# Patient Record
Sex: Female | Born: 1975 | Race: Black or African American | Hispanic: No | Marital: Married | Smoking: Current every day smoker
Health system: Southern US, Community
[De-identification: ages and names within clinical notes are randomized; demographics above are authoritative.]

## PROBLEM LIST (undated history)

## (undated) DIAGNOSIS — G4734 Idiopathic sleep related nonobstructive alveolar hypoventilation: Secondary | ICD-10-CM

## (undated) DIAGNOSIS — R079 Chest pain, unspecified: Secondary | ICD-10-CM

## (undated) DIAGNOSIS — M1990 Unspecified osteoarthritis, unspecified site: Secondary | ICD-10-CM

## (undated) DIAGNOSIS — G43909 Migraine, unspecified, not intractable, without status migrainosus: Secondary | ICD-10-CM

## (undated) DIAGNOSIS — I10 Essential (primary) hypertension: Secondary | ICD-10-CM

## (undated) DIAGNOSIS — Z720 Tobacco use: Secondary | ICD-10-CM

## (undated) DIAGNOSIS — G5600 Carpal tunnel syndrome, unspecified upper limb: Secondary | ICD-10-CM

## (undated) DIAGNOSIS — I5032 Chronic diastolic (congestive) heart failure: Secondary | ICD-10-CM

## (undated) DIAGNOSIS — E662 Morbid (severe) obesity with alveolar hypoventilation: Secondary | ICD-10-CM

## (undated) HISTORY — PX: WRIST SURGERY: SHX841

## (undated) HISTORY — DX: Tobacco use: Z72.0

## (undated) HISTORY — PX: TONSILLECTOMY: SUR1361

## (undated) HISTORY — PX: TUBAL LIGATION: SHX77

## (undated) HISTORY — DX: Idiopathic sleep related nonobstructive alveolar hypoventilation: G47.34

## (undated) HISTORY — PX: CHOLECYSTECTOMY: SHX55

## (undated) HISTORY — DX: Chest pain, unspecified: R07.9

## (undated) HISTORY — DX: Morbid (severe) obesity due to excess calories: E66.01

## (undated) HISTORY — DX: Chronic diastolic (congestive) heart failure: I50.32

## (undated) HISTORY — DX: Morbid (severe) obesity with alveolar hypoventilation: E66.2

---

## 2009-03-07 ENCOUNTER — Emergency Department (HOSPITAL_COMMUNITY): Admission: EM | Admit: 2009-03-07 | Discharge: 2009-03-07 | Payer: Self-pay | Admitting: Emergency Medicine

## 2011-03-27 ENCOUNTER — Emergency Department (INDEPENDENT_AMBULATORY_CARE_PROVIDER_SITE_OTHER)
Admission: EM | Admit: 2011-03-27 | Discharge: 2011-03-27 | Disposition: A | Discharge status: Home / Self Care | Payer: Self-pay | Source: Home / Self Care | Attending: Family Medicine | Admitting: Family Medicine

## 2011-03-27 ENCOUNTER — Encounter (HOSPITAL_COMMUNITY): Payer: Self-pay | Admitting: *Deleted

## 2011-03-27 DIAGNOSIS — J069 Acute upper respiratory infection, unspecified: Secondary | ICD-10-CM

## 2011-03-27 MED ORDER — ACETAMINOPHEN-CODEINE 120-12 MG/5ML PO SUSP: 5.0000 mL | Freq: Four times a day (QID) | ORAL | Status: AC | PRN

## 2011-03-27 MED ORDER — IBUPROFEN 800 MG PO TABS: 800.0000 mg | ORAL_TABLET | Freq: Once | ORAL | Status: DC

## 2011-03-27 MED ORDER — ALBUTEROL SULFATE HFA 108 (90 BASE) MCG/ACT IN AERS: 1.0000 | INHALATION_SPRAY | Freq: Four times a day (QID) | RESPIRATORY_TRACT | Status: DC | PRN

## 2011-03-27 MED ORDER — IBUPROFEN 600 MG PO TABS: 600.0000 mg | ORAL_TABLET | Freq: Three times a day (TID) | ORAL | Status: AC | PRN

## 2011-03-27 MED ORDER — FEXOFENADINE-PSEUDOEPHED ER 60-120 MG PO TB12: 1.0000 | ORAL_TABLET | Freq: Two times a day (BID) | ORAL | Status: AC

## 2011-03-27 MED ORDER — IBUPROFEN 600 MG PO TABS
600.0000 mg | ORAL_TABLET | Freq: Once | ORAL | Status: DC
  Administered 2011-03-27: 800 mg via ORAL

## 2011-03-27 MED ORDER — AZITHROMYCIN 250 MG PO TABS: 250.0000 mg | ORAL_TABLET | Freq: Every day | ORAL | Status: AC

## 2011-03-27 MED ORDER — IBUPROFEN 800 MG PO TABS
ORAL_TABLET | ORAL | Status: AC
  Filled 2011-03-27: qty 1

## 2011-03-27 NOTE — ED Notes (Signed)
Onset 2-3 days ago with cough and nasal congestion.  Today c/o productive cough, weakness and sore throat.  Denies fever, but feels cold a lot

## 2011-03-27 NOTE — ED Provider Notes (Signed)
History     CSN: [id number]  Arrival date & time 03/27/11  1448   First MD Initiated Contact with Patient 03/27/11 1613      Chief Complaint  Patient presents with  . Cough    (Consider location/radiation/quality/duration/timing/severity/associated sxs/prior treatment) HPI Comments: 36 y/o obese, smoker female here c/o cough and congestion for 3 days.y cough productive with white phlegm, cough worse at night. No PND. No chest pain but upper abdominal pain bilateral with coughing. Symptomas associated with sore throat, headache and bilateral ear pressure. Feels tired. No fever but feels cold. No shortness of breath. Swallowing solids and fluids well. No history of diabetes.   Past Medical History  Diagnosis Date  . Obesity     Past Surgical History  Procedure Date  . Tonsillectomy   . Tonsillectomy     Family History  Problem Relation Age of Onset  . Hypertension Mother   . Cancer Mother   . Coronary artery disease Mother   . Osteoarthritis Mother   . Diabetes Father   . Hypertension Father   . Asthma Father   . Asthma Sister   . Diabetes Sister     History  Substance Use Topics  . Smoking status: Current Everyday Smoker -- 0.5 packs/day for 10 years    Types: Cigarettes  . Smokeless tobacco: Not on file  . Alcohol Use: No    OB History    Grav Para Term Preterm Abortions TAB SAB Ect Mult Living                  Review of Systems  Constitutional: Negative for fever and appetite change.  HENT: Positive for ear pain, congestion, sore throat and rhinorrhea. Negative for trouble swallowing, neck pain, voice change and sinus pressure.   Respiratory: Positive for cough. Negative for shortness of breath.   Cardiovascular: Negative for chest pain and leg swelling.  Gastrointestinal: Negative for nausea, vomiting and diarrhea.  Skin: Negative for rash.  Neurological: Positive for headaches.    Allergies  Review of patient's allergies indicates no known  allergies.  Home Medications   Current Outpatient Rx  Name Route Sig Dispense Refill  . ACETAMINOPHEN-CODEINE 120-12 MG/5ML PO SUSP Oral Take 5 mLs by mouth every 6 (six) hours as needed for pain. 60 mL 0  . ALBUTEROL SULFATE HFA 108 (90 BASE) MCG/ACT IN AERS Inhalation Inhale 1-2 puffs into the lungs every 6 (six) hours as needed for wheezing. 1 Inhaler 0  . AZITHROMYCIN 250 MG PO TABS Oral Take 1 tablet (250 mg total) by mouth daily. Take first 2 tablets together, then 1 every day until finished. 6 tablet 0  . FEXOFENADINE-PSEUDOEPHED ER 60-120 MG PO TB12 Oral Take 1 tablet by mouth every 12 (twelve) hours. 30 tablet 0  . IBUPROFEN 600 MG PO TABS Oral Take 1 tablet (600 mg total) by mouth every 8 (eight) hours as needed for pain or fever. 20 tablet 0    BP 132/87  Pulse 112  Temp(Src) 97.9 F (36.6 C) (Oral)  Resp 26  SpO2 99%  LMP 03/17/2011  Physical Exam  Nursing note and vitals reviewed. Constitutional: She is oriented to person, place, and time. No distress.       morbidly obese  HENT:       Nasal Congestion with erythema and swelling of nasal turbinates, clear rhinorrhea. Pharyngeal erythema no exudates. No uvula deviation. No trismus. TM's with increased vascular markings clear fluid behind TM's. No swelling.   Eyes:  Conjunctivae and EOM are normal. Pupils are equal, round, and reactive to light. Right eye exhibits no discharge. Left eye exhibits no discharge.  Neck: Neck supple. No JVD present. No thyromegaly present.  Cardiovascular: Regular rhythm and normal heart sounds.   Pulmonary/Chest: Effort normal and breath sounds normal. No respiratory distress. She has no wheezes. She has no rales. She exhibits no tenderness.  Abdominal: Soft. There is no tenderness.  Lymphadenopathy:    She has no cervical adenopathy.  Neurological: She is alert and oriented to person, place, and time.  Skin: No rash noted.    ED Course  Procedures (including critical care time)  Labs  Reviewed - No data to display No results found.   1. URI (upper respiratory infection)       MDM  Smoker with worsening cough and upper respiratory symptoms for 3 days. Encouraged to quit smoking. Decided to treat with albuterol, azithromycin, decongestant/antihistamin, ibuprofen. Red flags for respiratory distress that require return to emergency services discussed.        Sharin Grave, MD 03/28/11 1016

## 2011-03-30 ENCOUNTER — Telehealth (HOSPITAL_COMMUNITY): Payer: Self-pay | Admitting: *Deleted

## 2012-04-18 ENCOUNTER — Emergency Department (INDEPENDENT_AMBULATORY_CARE_PROVIDER_SITE_OTHER)
Admission: EM | Admit: 2012-04-18 | Discharge: 2012-04-18 | Disposition: A | Discharge status: Home / Self Care | Payer: Self-pay | Source: Home / Self Care | Attending: Emergency Medicine | Admitting: Emergency Medicine

## 2012-04-18 ENCOUNTER — Encounter (HOSPITAL_COMMUNITY): Payer: Self-pay | Admitting: Emergency Medicine

## 2012-04-18 ENCOUNTER — Emergency Department (INDEPENDENT_AMBULATORY_CARE_PROVIDER_SITE_OTHER): Payer: Self-pay

## 2012-04-18 DIAGNOSIS — J40 Bronchitis, not specified as acute or chronic: Secondary | ICD-10-CM

## 2012-04-18 DIAGNOSIS — J45909 Unspecified asthma, uncomplicated: Secondary | ICD-10-CM

## 2012-04-18 HISTORY — DX: Essential (primary) hypertension: I10

## 2012-04-18 HISTORY — DX: Unspecified osteoarthritis, unspecified site: M19.90

## 2012-04-18 HISTORY — DX: Carpal tunnel syndrome, unspecified upper limb: G56.00

## 2012-04-18 MED ORDER — ALBUTEROL SULFATE (5 MG/ML) 0.5% IN NEBU
INHALATION_SOLUTION | RESPIRATORY_TRACT | Status: AC
  Filled 2012-04-18: qty 1

## 2012-04-18 MED ORDER — ALBUTEROL SULFATE (5 MG/ML) 0.5% IN NEBU
5.0000 mg | INHALATION_SOLUTION | Freq: Once | RESPIRATORY_TRACT | Status: AC
  Administered 2012-04-18: 5 mg via RESPIRATORY_TRACT

## 2012-04-18 MED ORDER — ALBUTEROL SULFATE HFA 108 (90 BASE) MCG/ACT IN AERS: 1.0000 | INHALATION_SPRAY | Freq: Four times a day (QID) | RESPIRATORY_TRACT | Status: DC | PRN

## 2012-04-18 MED ORDER — PREDNISONE 20 MG PO TABS: 40.0000 mg | ORAL_TABLET | Freq: Every day | ORAL | Status: DC

## 2012-04-18 MED ORDER — AZITHROMYCIN 250 MG PO TABS: 250.0000 mg | ORAL_TABLET | Freq: Every day | ORAL | Status: DC

## 2012-04-18 NOTE — ED Notes (Signed)
Holding treatment until post xray

## 2012-04-18 NOTE — ED Notes (Signed)
Patient placed in gown for radiology procedure.

## 2012-04-18 NOTE — ED Notes (Signed)
Patient's treatment in progress, not able to be discharged presently

## 2012-04-18 NOTE — ED Notes (Signed)
Cough for a week, sob for 3 days.  Reports runny nose, slight sore throat, soreness around rib cage.

## 2012-04-18 NOTE — ED Provider Notes (Addendum)
History     CSN: [id number]  Arrival date & time 04/18/12  1243   First MD Initiated Contact with Patient 04/18/12 1338      Chief Complaint  Patient presents with  . Cough    (Consider location/radiation/quality/duration/timing/severity/associated sxs/prior treatment) HPI Comments: Patient process urgent care describing this she's been coughing and having a cold for about a week and a half, and for the last 3 days her symptoms had worsened she is right now that her cough is productive of phlegm sometimes looks yellowish in character. Described as having trouble breathing she gets short of breath has also some ongoing upper congestion  Such as a runny and congested nose and a slight sore throat." Been coughing so much that now my ribs hurt when I cough". Patient denies any abdominal pain nausea vomiting headaches or chest pain. Patient is a current smoker  Patient is a 37 y.o. female presenting with cough. The history is provided by the patient.  Cough Cough characteristics:  Productive and hoarse Sputum characteristics:  Yellow Severity:  Moderate Onset quality:  Sudden Duration:  1 week Timing:  Constant Progression:  Worsening Chronicity:  New Smoker: no   Context: not occupational exposure   Relieved by:  Nothing Worsened by:  Nothing tried Associated symptoms: chills, fever, rhinorrhea, sinus congestion, sore throat and wheezing   Associated symptoms: no diaphoresis, no ear pain and no headaches     Past Medical History  Diagnosis Date  . Obesity   . Bronchitis   . Hypertension   . Arthritis   . Carpal tunnel syndrome     Past Surgical History  Procedure Laterality Date  . Tonsillectomy    . Tonsillectomy    . Tubal ligation    . Wrist surgery      bilateral, carpal tunnel    Family History  Problem Relation Age of Onset  . Hypertension Mother   . Cancer Mother   . Coronary artery disease Mother   . Osteoarthritis Mother   . Diabetes Father   .  Hypertension Father   . Asthma Father   . Asthma Sister   . Diabetes Sister     History  Substance Use Topics  . Smoking status: Current Every Day Smoker -- 0.50 packs/day for 10 years    Types: Cigarettes  . Smokeless tobacco: Not on file  . Alcohol Use: No    OB History   Grav Para Term Preterm Abortions TAB SAB Ect Mult Living                  Review of Systems  Constitutional: Positive for fever, chills and activity change. Negative for diaphoresis.  HENT: Positive for sore throat and rhinorrhea. Negative for ear pain.   Respiratory: Positive for cough and wheezing. Negative for choking and stridor.   Gastrointestinal: Negative for nausea, vomiting and abdominal pain.  Musculoskeletal: Negative for back pain.  Skin: Negative for color change.  Neurological: Negative for weakness and headaches.    Allergies  Review of patient's allergies indicates no known allergies.  Home Medications   Current Outpatient Rx  Name  Route  Sig  Dispense  Refill  . OVER THE COUNTER MEDICATION      otc cough syrup and pain medicine         . EXPIRED: albuterol (PROVENTIL HFA;VENTOLIN HFA) 108 (90 BASE) MCG/ACT inhaler   Inhalation   Inhale 1-2 puffs into the lungs every 6 (six) hours as needed for wheezing.  1 Inhaler   0   . albuterol (PROVENTIL HFA;VENTOLIN HFA) 108 (90 BASE) MCG/ACT inhaler   Inhalation   Inhale 1-2 puffs into the lungs every 6 (six) hours as needed for wheezing.   1 Inhaler   0   . azithromycin (ZITHROMAX) 250 MG tablet   Oral   Take 1 tablet (250 mg total) by mouth daily. Take first 2 tablets together, then 1 every day until finished.   6 tablet   0   . predniSONE (DELTASONE) 20 MG tablet   Oral   Take 2 tablets (40 mg total) by mouth daily. 2 tablets daily for 5 days   10 tablet   0     BP 123/78  Pulse 90  Temp(Src) 98.7 F (37.1 C) (Oral)  Resp 30  SpO2 96%  LMP 04/18/2012  Physical Exam  Constitutional: She is oriented to person,  place, and time.  Morbid obesity  HENT:  Head: Normocephalic.  Right Ear: Tympanic membrane normal.  Left Ear: Tympanic membrane normal.  Mouth/Throat: Uvula is midline. Posterior oropharyngeal erythema present.  Eyes: Conjunctivae are normal. Pupils are equal, round, and reactive to light.  Neck: Neck supple. No JVD present.  Cardiovascular: Normal rate.  Exam reveals no gallop and no friction rub.   No murmur heard. Pulmonary/Chest: Effort normal. No accessory muscle usage. Not bradypneic. No respiratory distress. She has no decreased breath sounds. She has wheezes. She has no rales.   She exhibits no tenderness.  Neurological: She is alert and oriented to person, place, and time.  Skin: Skin is warm. No rash noted. No erythema.    ED Course  Procedures (including critical care time)  Labs Reviewed - No data to display Dg Chest 2 View  04/18/2012  *RADIOLOGY REPORT*  Clinical Data: Cough  CHEST - 2 VIEW  Comparison: 03/07/2009  Findings: Low volumes.  Bibasilar atelectasis.  Normal heart size. No consolidation or mass.  No pleural effusion.  No pneumothorax.  IMPRESSION: Bibasilar atelectasis.   Original Report Authenticated By: Jolaine Click, M.D.      1. Bronchitis   2. Reactive airway disease with wheezing       MDM  Problem #1 respiratory symptoms for more than a week with expiratory wheezing. X-rays revealed no pneumonic process. Bibasilar atelectasis is seen most likely secondary to body habitus. Patient responded well to a nebulizer treatment at urgent care. Have prescribed patient a course of 5 days of prednisone along with albuterol usage instructed the every 4-6 hours and a macrolide antibiotic course for 5 days. Patient has been instructed about what symptoms should warrant further evaluation in the emergency department if worsening symptoms or her return for recheck. Patient agrees with both treatment plan and followup care he is also requesting a doctor's note for  work. Which has been provided for the next 48 hours        Jimmie Molly, MD 04/18/12 1453  Jimmie Molly, MD 04/18/12 [phone]

## 2012-04-28 ENCOUNTER — Emergency Department (INDEPENDENT_AMBULATORY_CARE_PROVIDER_SITE_OTHER)
Admission: EM | Admit: 2012-04-28 | Discharge: 2012-04-28 | Disposition: A | Discharge status: Home / Self Care | Payer: Self-pay | Source: Home / Self Care | Attending: Emergency Medicine | Admitting: Emergency Medicine

## 2012-04-28 ENCOUNTER — Emergency Department (INDEPENDENT_AMBULATORY_CARE_PROVIDER_SITE_OTHER): Payer: Self-pay

## 2012-04-28 ENCOUNTER — Encounter (HOSPITAL_COMMUNITY): Payer: Self-pay

## 2012-04-28 DIAGNOSIS — R05 Cough: Secondary | ICD-10-CM

## 2012-04-28 DIAGNOSIS — J069 Acute upper respiratory infection, unspecified: Secondary | ICD-10-CM

## 2012-04-28 DIAGNOSIS — J45909 Unspecified asthma, uncomplicated: Secondary | ICD-10-CM

## 2012-04-28 MED ORDER — ALBUTEROL SULFATE (5 MG/ML) 0.5% IN NEBU
INHALATION_SOLUTION | RESPIRATORY_TRACT | Status: AC
  Filled 2012-04-28: qty 1

## 2012-04-28 MED ORDER — IPRATROPIUM BROMIDE 0.02 % IN SOLN
0.5000 mg | Freq: Once | RESPIRATORY_TRACT | Status: AC
  Administered 2012-04-28: 0.5 mg via RESPIRATORY_TRACT

## 2012-04-28 MED ORDER — METHYLPREDNISOLONE 4 MG PO KIT: PACK | ORAL | Status: DC

## 2012-04-28 MED ORDER — ALBUTEROL SULFATE (5 MG/ML) 0.5% IN NEBU
5.0000 mg | INHALATION_SOLUTION | Freq: Once | RESPIRATORY_TRACT | Status: AC
  Administered 2012-04-28: 5 mg via RESPIRATORY_TRACT

## 2012-04-28 MED ORDER — BECLOMETHASONE DIPROPIONATE 80 MCG/ACT IN AERS: INHALATION_SPRAY | RESPIRATORY_TRACT | Status: DC

## 2012-04-28 MED ORDER — PHENYLEPHRINE-CHLORPHEN-DM 10-4-12.5 MG/5ML PO LIQD: 5.0000 mL | ORAL | Status: DC | PRN

## 2012-04-28 MED ORDER — TRIAMCINOLONE ACETONIDE 40 MG/ML IJ SUSP
60.0000 mg | Freq: Once | INTRAMUSCULAR | Status: AC
  Administered 2012-04-28: 60 mg via INTRAMUSCULAR

## 2012-04-28 NOTE — ED Notes (Signed)
Pt states she has never gotten completely well from her 2-21 visit, continues to be SOB, and now it hurts to cough

## 2012-04-28 NOTE — ED Provider Notes (Signed)
Medical screening examination/treatment/procedure(s) were performed by non-physician practitioner and as supervising physician I was immediately available for consultation/collaboration.  Leslee Home, M.D.  Reuben Likes, MD 04/28/12 [phone]

## 2012-04-28 NOTE — ED Provider Notes (Signed)
History     CSN: [id number]  Arrival date & time 04/28/12  1018   First MD Initiated Contact with Patient 04/28/12 1021      Chief Complaint  Patient presents with  . Shortness of Breath    (Consider location/radiation/quality/duration/timing/severity/associated sxs/prior treatment) HPI Comments: 37 year old morbidly and severe obese female is here mostly 8 days after she was seen in the urgent care for pulmonary complaints. She had been diagnosed with wheezing, cough cold and bronchitis. She was treated with nebs, prednisone and Z-Pak. Today she states she is not any better she still has a cough and she is hurting in her ribs which she coughs.   Past Medical History  Diagnosis Date  . Obesity   . Bronchitis   . Hypertension   . Arthritis   . Carpal tunnel syndrome     Past Surgical History  Procedure Laterality Date  . Tonsillectomy    . Tonsillectomy    . Tubal ligation    . Wrist surgery      bilateral, carpal tunnel    Family History  Problem Relation Age of Onset  . Hypertension Mother   . Cancer Mother   . Coronary artery disease Mother   . Osteoarthritis Mother   . Diabetes Father   . Hypertension Father   . Asthma Father   . Asthma Sister   . Diabetes Sister     History  Substance Use Topics  . Smoking status: Current Every Day Smoker -- 0.50 packs/day for 10 years    Types: Cigarettes  . Smokeless tobacco: Not on file  . Alcohol Use: No    OB History   Grav Para Term Preterm Abortions TAB SAB Ect Mult Living                  Review of Systems  Constitutional: Negative for fever, chills, activity change, appetite change and fatigue.  HENT: Positive for congestion, rhinorrhea and postnasal drip. Negative for sore throat, facial swelling, neck pain and neck stiffness.   Eyes: Negative.   Respiratory: Positive for cough, shortness of breath and wheezing.   Cardiovascular: Positive for chest pain.  Gastrointestinal: Negative.   Skin: Negative  for pallor and rash.  Neurological: Negative.     Allergies  Review of patient's allergies indicates no known allergies.  Home Medications   Current Outpatient Rx  Name  Route  Sig  Dispense  Refill  . azithromycin (ZITHROMAX) 250 MG tablet   Oral   Take 1 tablet (250 mg total) by mouth daily. Take first 2 tablets together, then 1 every day until finished.   6 tablet   0   . EXPIRED: albuterol (PROVENTIL HFA;VENTOLIN HFA) 108 (90 BASE) MCG/ACT inhaler   Inhalation   Inhale 1-2 puffs into the lungs every 6 (six) hours as needed for wheezing.   1 Inhaler   0   . albuterol (PROVENTIL HFA;VENTOLIN HFA) 108 (90 BASE) MCG/ACT inhaler   Inhalation   Inhale 1-2 puffs into the lungs every 6 (six) hours as needed for wheezing.   1 Inhaler   0   . beclomethasone (QVAR) 80 MCG/ACT inhaler      Inhale 1 puff daily   1 Inhaler   12   . methylPREDNISolone (MEDROL DOSEPAK) 4 MG tablet      follow package directions   21 tablet   0   . OVER THE COUNTER MEDICATION      otc cough syrup and pain medicine         .  Phenylephrine-Chlorphen-DM 11-30-10.5 MG/5ML LIQD   Oral   Take 5 mLs by mouth every 4 (four) hours as needed.   120 mL   0   . predniSONE (DELTASONE) 20 MG tablet   Oral   Take 2 tablets (40 mg total) by mouth daily. 2 tablets daily for 5 days   10 tablet   0     BP 105/53  Pulse 26  Temp(Src) 97.9 F (36.6 C) (Oral)  SpO2 99%  LMP 04/16/2012  Physical Exam  Constitutional: She is oriented to person, place, and time. She appears well-developed and well-nourished. No distress.  Severe morbid obesity  HENT:  Mouth/Throat: Oropharynx is clear and moist. No oropharyngeal exudate.  Eyes: Conjunctivae and EOM are normal.  Neck: Normal range of motion. Neck supple.  Cardiovascular: Normal rate, regular rhythm and normal heart sounds.   Pulmonary/Chest: Effort normal and breath sounds normal. No respiratory distress.  Rare, faint expiratory wheeze.   Musculoskeletal: Normal range of motion. She exhibits no edema.  Lymphadenopathy:    She has no cervical adenopathy.  Neurological: She is alert and oriented to person, place, and time.  Skin: Skin is warm and dry. No rash noted.  Psychiatric: She has a normal mood and affect.    ED Course  Procedures (including critical care time)  Labs Reviewed - No data to display Dg Chest 2 View  04/28/2012  *RADIOLOGY REPORT*  Clinical Data: Cough and short of breath  CHEST - 2 VIEW  Comparison: 04/18/2012  Findings: Suboptimal images due to large patient size.  Negative for heart failure.  Increased density in the posterior lung base on the lateral view is most likely overlying soft tissues.  No definite pneumonia or effusion is identified.  IMPRESSION: No acute abnormality.   Original Report Authenticated By: Janeece Riggers, M.D.      1. URI (upper respiratory infection)   2. RAD (reactive airway disease) with wheezing   3. Cough       MDM  The patient had a duo neb. Post neb there was moderate improvement. No wheezes auscultated. Chest x-ray is negative for acute pulmonary disease Norell CS 1 teaspoon every 4 hours when necessary drainage and cough. Qvar 80 mg one puff daily Medrol 1 dose pack Plenty of fluids Out of work today        Hayden Rasmussen, NP 04/28/12 1242

## 2012-10-23 ENCOUNTER — Encounter (HOSPITAL_COMMUNITY): Payer: Self-pay | Admitting: Emergency Medicine

## 2012-10-23 ENCOUNTER — Emergency Department (INDEPENDENT_AMBULATORY_CARE_PROVIDER_SITE_OTHER)
Admission: EM | Admit: 2012-10-23 | Discharge: 2012-10-23 | Disposition: A | Discharge status: Home / Self Care | Payer: Self-pay | Source: Home / Self Care

## 2012-10-23 DIAGNOSIS — R0982 Postnasal drip: Secondary | ICD-10-CM

## 2012-10-23 DIAGNOSIS — J309 Allergic rhinitis, unspecified: Secondary | ICD-10-CM

## 2012-10-23 DIAGNOSIS — J45909 Unspecified asthma, uncomplicated: Secondary | ICD-10-CM

## 2012-10-23 MED ORDER — PREDNISONE 20 MG PO TABS: ORAL_TABLET | ORAL | Status: DC

## 2012-10-23 MED ORDER — ALBUTEROL SULFATE (5 MG/ML) 0.5% IN NEBU
INHALATION_SOLUTION | RESPIRATORY_TRACT | Status: AC
  Filled 2012-10-23: qty 1

## 2012-10-23 MED ORDER — PHENYLEPHRINE-CHLORPHEN-DM 10-4-12.5 MG/5ML PO LIQD: 5.0000 mL | ORAL | Status: DC | PRN

## 2012-10-23 MED ORDER — ALBUTEROL SULFATE (5 MG/ML) 0.5% IN NEBU
5.0000 mg | INHALATION_SOLUTION | Freq: Once | RESPIRATORY_TRACT | Status: AC
  Administered 2012-10-23: 5 mg via RESPIRATORY_TRACT

## 2012-10-23 NOTE — ED Provider Notes (Signed)
CSN: [id number]     Arrival date & time 10/23/12  1100 History   First MD Initiated Contact with Patient 10/23/12 1125     Chief Complaint  Patient presents with  . Asthma   (Consider location/radiation/quality/duration/timing/severity/associated sxs/prior Treatment) HPI Comments: 37 year old severe and morbidly obese female complaining of 3 weeks of difficulty breathing with upper respiratory congestion and a feeling of congestion in the upper chest, sneezing and coughing. She gets hot easily and that causes her to have migraines. She initially stated she had a sore throat but was talking about a sore throat for 3 weeks ago. She has postnasal discharge. Smokes one half to one pack per day. Does not have a history of asthma.   Past Medical History  Diagnosis Date  . Obesity   . Bronchitis   . Hypertension   . Arthritis   . Carpal tunnel syndrome    Past Surgical History  Procedure Laterality Date  . Tonsillectomy    . Tonsillectomy    . Tubal ligation    . Wrist surgery      bilateral, carpal tunnel   Family History  Problem Relation Age of Onset  . Hypertension Mother   . Cancer Mother   . Coronary artery disease Mother   . Osteoarthritis Mother   . Diabetes Father   . Hypertension Father   . Asthma Father   . Asthma Sister   . Diabetes Sister    History  Substance Use Topics  . Smoking status: Current Every Day Smoker -- 0.50 packs/day for 10 years    Types: Cigarettes  . Smokeless tobacco: Not on file  . Alcohol Use: No   OB History   Grav Para Term Preterm Abortions TAB SAB Ect Mult Living                 Review of Systems  Constitutional: Positive for activity change. Negative for fever.  HENT: Positive for congestion, rhinorrhea, sneezing and postnasal drip. Negative for ear pain, sore throat and neck pain.   Respiratory: Positive for cough and shortness of breath.   Cardiovascular: Negative.   Gastrointestinal: Negative.   Skin: Negative.    Neurological: Negative.     Allergies  Review of patient's allergies indicates no known allergies.  Home Medications   Current Outpatient Rx  Name  Route  Sig  Dispense  Refill  . EXPIRED: albuterol (PROVENTIL HFA;VENTOLIN HFA) 108 (90 BASE) MCG/ACT inhaler   Inhalation   Inhale 1-2 puffs into the lungs every 6 (six) hours as needed for wheezing.   1 Inhaler   0   . albuterol (PROVENTIL HFA;VENTOLIN HFA) 108 (90 BASE) MCG/ACT inhaler   Inhalation   Inhale 1-2 puffs into the lungs every 6 (six) hours as needed for wheezing.   1 Inhaler   0   . azithromycin (ZITHROMAX) 250 MG tablet   Oral   Take 1 tablet (250 mg total) by mouth daily. Take first 2 tablets together, then 1 every day until finished.   6 tablet   0   . beclomethasone (QVAR) 80 MCG/ACT inhaler      Inhale 1 puff daily   1 Inhaler   12   . methylPREDNISolone (MEDROL DOSEPAK) 4 MG tablet      follow package directions   21 tablet   0   . OVER THE COUNTER MEDICATION      otc cough syrup and pain medicine         . Phenylephrine-Chlorphen-DM 11-30-10.5  MG/5ML LIQD   Oral   Take 5 mLs by mouth every 4 (four) hours as needed.   120 mL   0   . Phenylephrine-Chlorphen-DM 11-30-10.5 MG/5ML LIQD   Oral   Take 5 mLs by mouth every 4 (four) hours as needed.   120 mL   0   . predniSONE (DELTASONE) 20 MG tablet   Oral   Take 2 tablets (40 mg total) by mouth daily. 2 tablets daily for 5 days   10 tablet   0   . predniSONE (DELTASONE) 20 MG tablet      Take 3 tabs po on first day, 2 tabs second day, 2 tabs third day, 1 tab fourth day, 1 tab 5th day. Take with food.   9 tablet   0    BP 92/58  Pulse 80  Temp(Src) 98 F (36.7 C) (Oral)  Resp 22  SpO2 100%  LMP 10/08/2012 Physical Exam  Nursing note and vitals reviewed. Constitutional: She is oriented to person, place, and time. No distress.  Severe morbid obesity. The patient remains sitting in the chair in the exam room. She complete  sentences and does not appear to be any respiratory distress.  HENT:  Right Ear: External ear normal.  Left Ear: External ear normal.  Mouth/Throat: No oropharyngeal exudate.  OP with minor erythema and clear PND.  Eyes: Conjunctivae are normal.  Neck: Normal range of motion. Neck supple.  Cardiovascular: Normal rate, regular rhythm and normal heart sounds.   Pulmonary/Chest:  Body habitus prevents good assessment of the lungs with auscultation. Air movement is fair. I do not hear any wheezes. She does have a loose cough and PND.  Lymphadenopathy:    She has no cervical adenopathy.  Neurological: She is alert and oriented to person, place, and time.  Skin: Skin is warm and dry. No rash noted.  Psychiatric: She has a normal mood and affect.    ED Course  Procedures (including critical care time) Labs Review Labs Reviewed - No data to display Imaging Review No results found.  MDM   1. Allergic rhinitis   2. PND (post-nasal drip)   3. RAD (reactive airway disease) with wheezing    Post albuterol neb treatment patient states she is breathing better and coughing less. Lung sounds are clear and there is improvement in air movement. No wheezing or other adventitious sounds. Her cough is due in part to PND and occult bronchospasm. Norell CS 1 teaspoon every 4 hours when necessary cough Continue using your albuterol, Qvar and other medications. Prednisone as directed. Followup your PCP as needed. For worsening new symptoms problems may return    Hayden Rasmussen, NP 10/23/12 1301

## 2012-10-23 NOTE — ED Notes (Signed)
C/o congestion and coughing for three weeks.  OTC medication taken but no relief.

## 2012-10-25 NOTE — ED Provider Notes (Signed)
Medical screening examination/treatment/procedure(s) were performed by a resident physician or non-physician practitioner and as the supervising physician I was immediately available for consultation/collaboration.  Twisha Vanpelt, MD   Johannah Rozas S Eleri Ruben, MD 10/25/12 0755 

## 2013-02-28 ENCOUNTER — Emergency Department (HOSPITAL_COMMUNITY)
Admission: EM | Admit: 2013-02-28 | Discharge: 2013-02-28 | Disposition: A | Discharge status: Home / Self Care | Payer: No Typology Code available for payment source | Attending: Emergency Medicine | Admitting: Emergency Medicine

## 2013-02-28 ENCOUNTER — Emergency Department (HOSPITAL_COMMUNITY)
Admission: EM | Admit: 2013-02-28 | Discharge: 2013-02-28 | Disposition: A | Discharge status: Other Acute Inpatient Hospital | Payer: No Typology Code available for payment source | Source: Home / Self Care | Attending: Family Medicine | Admitting: Family Medicine

## 2013-02-28 ENCOUNTER — Emergency Department (HOSPITAL_COMMUNITY): Payer: No Typology Code available for payment source

## 2013-02-28 ENCOUNTER — Encounter (HOSPITAL_COMMUNITY): Payer: Self-pay | Admitting: Emergency Medicine

## 2013-02-28 DIAGNOSIS — I10 Essential (primary) hypertension: Secondary | ICD-10-CM | POA: Insufficient documentation

## 2013-02-28 DIAGNOSIS — Z79899 Other long term (current) drug therapy: Secondary | ICD-10-CM | POA: Insufficient documentation

## 2013-02-28 DIAGNOSIS — Z8669 Personal history of other diseases of the nervous system and sense organs: Secondary | ICD-10-CM | POA: Insufficient documentation

## 2013-02-28 DIAGNOSIS — F17200 Nicotine dependence, unspecified, uncomplicated: Secondary | ICD-10-CM | POA: Insufficient documentation

## 2013-02-28 DIAGNOSIS — J069 Acute upper respiratory infection, unspecified: Secondary | ICD-10-CM | POA: Insufficient documentation

## 2013-02-28 DIAGNOSIS — J4521 Mild intermittent asthma with (acute) exacerbation: Secondary | ICD-10-CM

## 2013-02-28 DIAGNOSIS — IMO0002 Reserved for concepts with insufficient information to code with codable children: Secondary | ICD-10-CM | POA: Insufficient documentation

## 2013-02-28 DIAGNOSIS — J45901 Unspecified asthma with (acute) exacerbation: Secondary | ICD-10-CM

## 2013-02-28 DIAGNOSIS — Z792 Long term (current) use of antibiotics: Secondary | ICD-10-CM | POA: Insufficient documentation

## 2013-02-28 DIAGNOSIS — M129 Arthropathy, unspecified: Secondary | ICD-10-CM | POA: Insufficient documentation

## 2013-02-28 MED ORDER — PREDNISONE 20 MG PO TABS
60.0000 mg | ORAL_TABLET | Freq: Once | ORAL | Status: AC
  Administered 2013-02-28: 60 mg via ORAL
  Filled 2013-02-28: qty 3

## 2013-02-28 MED ORDER — ALBUTEROL SULFATE HFA 108 (90 BASE) MCG/ACT IN AERS: 1.0000 | INHALATION_SPRAY | Freq: Four times a day (QID) | RESPIRATORY_TRACT | Status: DC | PRN

## 2013-02-28 MED ORDER — IPRATROPIUM BROMIDE 0.02 % IN SOLN
0.5000 mg | Freq: Once | RESPIRATORY_TRACT | Status: AC
  Administered 2013-02-28: 0.5 mg via RESPIRATORY_TRACT
  Filled 2013-02-28: qty 2.5

## 2013-02-28 MED ORDER — PREDNISONE 20 MG PO TABS: 60.0000 mg | ORAL_TABLET | Freq: Every day | ORAL | Status: DC

## 2013-02-28 MED ORDER — IPRATROPIUM BROMIDE 0.02 % IN SOLN
0.5000 mg | Freq: Once | RESPIRATORY_TRACT | Status: AC
  Administered 2013-02-28: 0.5 mg via RESPIRATORY_TRACT

## 2013-02-28 MED ORDER — ALBUTEROL SULFATE (2.5 MG/3ML) 0.083% IN NEBU
5.0000 mg | INHALATION_SOLUTION | Freq: Once | RESPIRATORY_TRACT | Status: AC
  Administered 2013-02-28: 5 mg via RESPIRATORY_TRACT

## 2013-02-28 MED ORDER — IPRATROPIUM BROMIDE 0.02 % IN SOLN
RESPIRATORY_TRACT | Status: AC
  Filled 2013-02-28: qty 2.5

## 2013-02-28 MED ORDER — HYDROCODONE-HOMATROPINE 5-1.5 MG/5ML PO SYRP: 5.0000 mL | ORAL_SOLUTION | Freq: Four times a day (QID) | ORAL | Status: DC | PRN

## 2013-02-28 MED ORDER — ALBUTEROL SULFATE (2.5 MG/3ML) 0.083% IN NEBU
5.0000 mg | INHALATION_SOLUTION | Freq: Once | RESPIRATORY_TRACT | Status: AC
  Administered 2013-02-28: 5 mg via RESPIRATORY_TRACT
  Filled 2013-02-28: qty 6

## 2013-02-28 MED ORDER — ALBUTEROL SULFATE (2.5 MG/3ML) 0.083% IN NEBU
INHALATION_SOLUTION | RESPIRATORY_TRACT | Status: AC
  Filled 2013-02-28: qty 6

## 2013-02-28 NOTE — ED Provider Notes (Signed)
Medical screening examination/treatment/procedure(s) were performed by non-physician practitioner and as supervising physician I was immediately available for consultation/collaboration.  EKG Interpretation   None         William Jaelah Hauth, MD 02/28/13 1553 

## 2013-02-28 NOTE — ED Provider Notes (Signed)
CSN: [id number]     Arrival date & time 02/28/13  1004 History   First MD Initiated Contact with Patient 02/28/13 1014     Chief Complaint  Patient presents with  . Cough   (Consider location/radiation/quality/duration/timing/severity/associated sxs/prior Treatment) Patient is a 38 y.o. female presenting with cough. The history is provided by the patient.  Cough Cough characteristics:  Non-productive, dry, hoarse and harsh Severity:  Moderate Onset quality:  Gradual Duration:  6 weeks Progression:  Worsening Chronicity:  Recurrent (sick since Thanksgiving ,off and on, relapsed sev days ago ,with cough and sob, with dry heaves and diarrhea since yest.) Smoker: yes   Context: upper respiratory infection   Associated symptoms: chest pain, shortness of breath and wheezing   Associated symptoms: no fever     Past Medical History  Diagnosis Date  . Obesity   . Bronchitis   . Hypertension   . Arthritis   . Carpal tunnel syndrome    Past Surgical History  Procedure Laterality Date  . Tonsillectomy    . Tonsillectomy    . Tubal ligation    . Wrist surgery      bilateral, carpal tunnel   Family History  Problem Relation Age of Onset  . Hypertension Mother   . Cancer Mother   . Coronary artery disease Mother   . Osteoarthritis Mother   . Diabetes Father   . Hypertension Father   . Asthma Father   . Asthma Sister   . Diabetes Sister    History  Substance Use Topics  . Smoking status: Current Every Day Smoker -- 0.50 packs/day for 10 years    Types: Cigarettes  . Smokeless tobacco: Not on file  . Alcohol Use: No   OB History   Grav Para Term Preterm Abortions TAB SAB Ect Mult Living                 Review of Systems  Constitutional: Negative.  Negative for fever.  Respiratory: Positive for cough, shortness of breath and wheezing.   Cardiovascular: Positive for chest pain.    Allergies  Review of patient's allergies indicates no known allergies.  Home  Medications   Current Outpatient Rx  Name  Route  Sig  Dispense  Refill  . EXPIRED: albuterol (PROVENTIL HFA;VENTOLIN HFA) 108 (90 BASE) MCG/ACT inhaler   Inhalation   Inhale 1-2 puffs into the lungs every 6 (six) hours as needed for wheezing.   1 Inhaler   0   . albuterol (PROVENTIL HFA;VENTOLIN HFA) 108 (90 BASE) MCG/ACT inhaler   Inhalation   Inhale 1-2 puffs into the lungs every 6 (six) hours as needed for wheezing.   1 Inhaler   0   . azithromycin (ZITHROMAX) 250 MG tablet   Oral   Take 1 tablet (250 mg total) by mouth daily. Take first 2 tablets together, then 1 every day until finished.   6 tablet   0   . beclomethasone (QVAR) 80 MCG/ACT inhaler      Inhale 1 puff daily   1 Inhaler   12   . methylPREDNISolone (MEDROL DOSEPAK) 4 MG tablet      follow package directions   21 tablet   0   . OVER THE COUNTER MEDICATION      otc cough syrup and pain medicine         . Phenylephrine-Chlorphen-DM 11-30-10.5 MG/5ML LIQD   Oral   Take 5 mLs by mouth every 4 (four) hours as needed.  120 mL   0   . Phenylephrine-Chlorphen-DM 11-30-10.5 MG/5ML LIQD   Oral   Take 5 mLs by mouth every 4 (four) hours as needed.   120 mL   0   . predniSONE (DELTASONE) 20 MG tablet   Oral   Take 2 tablets (40 mg total) by mouth daily. 2 tablets daily for 5 days   10 tablet   0   . predniSONE (DELTASONE) 20 MG tablet      Take 3 tabs po on first day, 2 tabs second day, 2 tabs third day, 1 tab fourth day, 1 tab 5th day. Take with food.   9 tablet   0    BP 150/82  Pulse 94  Temp(Src) 98.6 F (37 C) (Oral)  Resp 28  SpO2 96%  LMP 02/28/2013 Physical Exam  Nursing note and vitals reviewed. Constitutional: She is oriented to person, place, and time. She appears well-developed and well-nourished.  HENT:  Head: Normocephalic.  Mouth/Throat: Oropharynx is clear and moist.  Eyes: Conjunctivae are normal. Pupils are equal, round, and reactive to light.  Neck: Normal range of  motion. Neck supple.  Cardiovascular: Regular rhythm and normal heart sounds.   Pulmonary/Chest: She has decreased breath sounds. She has wheezes.   She exhibits tenderness.  Lymphadenopathy:    She has no cervical adenopathy.  Neurological: She is alert and oriented to person, place, and time.  Skin: Skin is warm and dry.    ED Course  Procedures (including critical care time) Labs Review Labs Reviewed - No data to display Imaging Review No results found.  EKG Interpretation    Date/Time:    Ventricular Rate:    PR Interval:    QRS Duration:   QT Interval:    QTC Calculation:   R Axis:     Text Interpretation:              MDM  Sent for x-ray and further pulmonary care.    Linna Hoff, MD 02/28/13 [phone]

## 2013-02-28 NOTE — ED Provider Notes (Signed)
CSN: [id number]     Arrival date & time 02/28/13  1207 History   First MD Initiated Contact with Patient 02/28/13 1332     Chief Complaint  Patient presents with  . Cough  . Asthma   (Consider location/radiation/quality/duration/timing/severity/associated sxs/prior Treatment) HPI Comments: Patient present with a chief complaint of cough and SOB.  She reports that her cough has been present for the past 6 weeks intermittently.  Cough reoccurred two days ago and has been gradually worsening since that time.  She reports that her cough is associated with some shortness of breath and wheezing.  [facility] to arrival in the ED and sent to the ED to obtain a chest xray.  She reports that she has never been formally diagnosed with Asthma, but has been on inhalers in the past.  She denies fever, chills, chest pain, nausea, vomiting, or lower extremity edema.  She reports that she has taken Robitussin for her symptoms without relief.  [facility], which she states helped with her SOB.  The history is provided by the patient.    Past Medical History  Diagnosis Date  . Obesity   . Bronchitis   . Hypertension   . Arthritis   . Carpal tunnel syndrome    Past Surgical History  Procedure Laterality Date  . Tonsillectomy    . Tonsillectomy    . Tubal ligation    . Wrist surgery      bilateral, carpal tunnel   Family History  Problem Relation Age of Onset  . Hypertension Mother   . Cancer Mother   . Coronary artery disease Mother   . Osteoarthritis Mother   . Diabetes Father   . Hypertension Father   . Asthma Father   . Asthma Sister   . Diabetes Sister    History  Substance Use Topics  . Smoking status: Current Every Day Smoker -- 0.50 packs/day for 10 years    Types: Cigarettes  . Smokeless tobacco: Not on file  . Alcohol Use: No   OB History   Grav Para Term Preterm Abortions TAB SAB Ect Mult Living                  Review of Systems  All other systems reviewed and are negative.    Allergies  Review of patient's allergies indicates no known allergies.  Home Medications   Current Outpatient Rx  Name  Route  Sig  Dispense  Refill  . EXPIRED: albuterol (PROVENTIL HFA;VENTOLIN HFA) 108 (90 BASE) MCG/ACT inhaler   Inhalation   Inhale 1-2 puffs into the lungs every 6 (six) hours as needed for wheezing.   1 Inhaler   0   . albuterol (PROVENTIL HFA;VENTOLIN HFA) 108 (90 BASE) MCG/ACT inhaler   Inhalation   Inhale 1-2 puffs into the lungs every 6 (six) hours as needed for wheezing.   1 Inhaler   0   . azithromycin (ZITHROMAX) 250 MG tablet   Oral   Take 1 tablet (250 mg total) by mouth daily. Take first 2 tablets together, then 1 every day until finished.   6 tablet   0   . beclomethasone (QVAR) 80 MCG/ACT inhaler      Inhale 1 puff daily   1 Inhaler   12   . methylPREDNISolone (MEDROL DOSEPAK) 4 MG tablet      follow package directions   21 tablet  0   . OVER THE COUNTER MEDICATION      otc cough syrup and pain medicine         . Phenylephrine-Chlorphen-DM 11-30-10.5 MG/5ML LIQD   Oral   Take 5 mLs by mouth every 4 (four) hours as needed.   120 mL   0   . Phenylephrine-Chlorphen-DM 11-30-10.5 MG/5ML LIQD   Oral   Take 5 mLs by mouth every 4 (four) hours as needed.   120 mL   0   . predniSONE (DELTASONE) 20 MG tablet   Oral   Take 2 tablets (40 mg total) by mouth daily. 2 tablets daily for 5 days   10 tablet   0   . predniSONE (DELTASONE) 20 MG tablet      Take 3 tabs po on first day, 2 tabs second day, 2 tabs third day, 1 tab fourth day, 1 tab 5th day. Take with food.   9 tablet   0    BP 141/82  Pulse 89  Temp(Src) 98.2 F (36.8 C) (Oral)  Resp 20  Wt 460 lb 9.6 oz (208.927 kg)  SpO2 99%  LMP 02/28/2013 Physical Exam  Nursing note and vitals reviewed. Constitutional: She appears well-developed and well-nourished.  Morbidly obese  HENT:   Head: Normocephalic and atraumatic.  Right Ear: Tympanic membrane and ear canal normal.  Left Ear: Tympanic membrane and ear canal normal.  Nose: Nose normal.  Mouth/Throat: Uvula is midline, oropharynx is clear and moist and mucous membranes are normal.  Neck: Normal range of motion. Neck supple.  Cardiovascular: Normal rate, regular rhythm and normal heart sounds.   Pulmonary/Chest: Effort normal and breath sounds normal. No respiratory distress. She has no wheezes. She has no rales.  Musculoskeletal: Normal range of motion.  Neurological: She is alert.  Skin: Skin is warm and dry.  Psychiatric: She has a normal mood and affect.    ED Course  Procedures (including critical care time) Labs Review Labs Reviewed - No data to display Imaging Review Dg Chest 2 View  02/28/2013   CLINICAL DATA:  Chest tightness.  EXAM: CHEST  2 VIEW  COMPARISON:  04/28/2012  FINDINGS: The heart size and mediastinal contours are within normal limits. Both lungs are clear. The visualized skeletal structures are unremarkable.  IMPRESSION: No active cardiopulmonary disease.   Electronically Signed   By: Richarda Overlie M.D.   On: 02/28/2013 14:39    EKG Interpretation   None      2:51 PM Reassessed patient.  She reports that her SOB has improved at this time.  Lungs CTAB. MDM  No diagnosis found. Patient presents today with a chief complaint of cough and SOB.  Pulse ox  99-100 on RA.  No signs of respiratory distress.  CXR negative.  Shortness of breath improved after getting breathing treatments.  Lungs CTAB.  Patient also given a five day course of Prednisone.  Feel that the the patient is stable for discharge.  Return precautions given.    Santiago Glad, PA-C 02/28/13 1545

## 2013-02-28 NOTE — ED Notes (Signed)
Pt  Reports  Symptoms  Of  Cough  Congestion        Tightness  In  Chest  As  Well  As  Ome  Difficulty  Breathing  For sev  Days   Pt reports  Symptoms  Not  releived  By otc  meds

## 2013-02-28 NOTE — ED Notes (Signed)
[facility] Having non productive cough x 6 weeks, hoarseness, sob. Pt received breathing tx at [facility]. Airway intact, no resp distress noted at triage. Pt is morbidly obese, sent here for xray.

## 2013-07-11 ENCOUNTER — Encounter (HOSPITAL_COMMUNITY): Payer: Self-pay | Admitting: Emergency Medicine

## 2013-07-11 ENCOUNTER — Emergency Department (HOSPITAL_COMMUNITY): Payer: No Typology Code available for payment source

## 2013-07-11 ENCOUNTER — Emergency Department (HOSPITAL_COMMUNITY)
Admission: EM | Admit: 2013-07-11 | Discharge: 2013-07-11 | Disposition: A | Discharge status: Home / Self Care | Payer: No Typology Code available for payment source | Attending: Emergency Medicine | Admitting: Emergency Medicine

## 2013-07-11 DIAGNOSIS — Z8739 Personal history of other diseases of the musculoskeletal system and connective tissue: Secondary | ICD-10-CM | POA: Insufficient documentation

## 2013-07-11 DIAGNOSIS — IMO0002 Reserved for concepts with insufficient information to code with codable children: Secondary | ICD-10-CM | POA: Insufficient documentation

## 2013-07-11 DIAGNOSIS — Z8669 Personal history of other diseases of the nervous system and sense organs: Secondary | ICD-10-CM | POA: Insufficient documentation

## 2013-07-11 DIAGNOSIS — J45901 Unspecified asthma with (acute) exacerbation: Secondary | ICD-10-CM | POA: Insufficient documentation

## 2013-07-11 DIAGNOSIS — Z79899 Other long term (current) drug therapy: Secondary | ICD-10-CM | POA: Insufficient documentation

## 2013-07-11 DIAGNOSIS — F17200 Nicotine dependence, unspecified, uncomplicated: Secondary | ICD-10-CM | POA: Insufficient documentation

## 2013-07-11 DIAGNOSIS — Z8709 Personal history of other diseases of the respiratory system: Secondary | ICD-10-CM | POA: Insufficient documentation

## 2013-07-11 DIAGNOSIS — I10 Essential (primary) hypertension: Secondary | ICD-10-CM | POA: Insufficient documentation

## 2013-07-11 DIAGNOSIS — J069 Acute upper respiratory infection, unspecified: Secondary | ICD-10-CM | POA: Insufficient documentation

## 2013-07-11 DIAGNOSIS — J45909 Unspecified asthma, uncomplicated: Secondary | ICD-10-CM

## 2013-07-11 LAB — BASIC METABOLIC PANEL
BUN: 5 mg/dL — AB (ref 6–23)
CHLORIDE: 102 meq/L (ref 96–112)
CO2: 21 mEq/L (ref 19–32)
Calcium: 9 mg/dL (ref 8.4–10.5)
Creatinine, Ser: 0.49 mg/dL — ABNORMAL LOW (ref 0.50–1.10)
GFR calc non Af Amer: >90 mL/min (ref >90)
Glucose, Bld: 178 mg/dL — ABNORMAL HIGH (ref 70–99)
POTASSIUM: 4.3 meq/L (ref 3.7–5.3)
Sodium: 138 mEq/L (ref 137–147)

## 2013-07-11 LAB — CBC
HEMATOCRIT: 42.2 % (ref 36.0–46.0)
HEMOGLOBIN: 13.9 g/dL (ref 12.0–15.0)
MCH: 33.4 pg (ref 26.0–34.0)
MCHC: 32.9 g/dL (ref 30.0–36.0)
MCV: 101.4 fL — ABNORMAL HIGH (ref 78.0–100.0)
Platelets: 347 10*3/uL (ref 150–400)
RBC: 4.16 MIL/uL (ref 3.87–5.11)
RDW: 13.8 % (ref 11.5–15.5)
WBC: 6.3 10*3/uL (ref 4.0–10.5)

## 2013-07-11 LAB — I-STAT TROPONIN, ED: Troponin i, poc: 0 ng/mL (ref 0.00–0.08)

## 2013-07-11 LAB — PRO B NATRIURETIC PEPTIDE: PRO B NATRI PEPTIDE: 14.6 pg/mL (ref 0–125)

## 2013-07-11 MED ORDER — IPRATROPIUM-ALBUTEROL 0.5-2.5 (3) MG/3ML IN SOLN
3.0000 mL | Freq: Once | RESPIRATORY_TRACT | Status: AC
  Administered 2013-07-11: 3 mL via RESPIRATORY_TRACT
  Filled 2013-07-11: qty 3

## 2013-07-11 MED ORDER — ALBUTEROL SULFATE HFA 108 (90 BASE) MCG/ACT IN AERS: 1.0000 | INHALATION_SPRAY | RESPIRATORY_TRACT | Status: DC | PRN

## 2013-07-11 MED ORDER — AZITHROMYCIN 250 MG PO TABS: ORAL_TABLET | ORAL | Status: DC

## 2013-07-11 MED ORDER — PREDNISONE 20 MG PO TABS
60.0000 mg | ORAL_TABLET | Freq: Once | ORAL | Status: AC
  Administered 2013-07-11: 60 mg via ORAL
  Filled 2013-07-11: qty 3

## 2013-07-11 MED ORDER — PREDNISONE 20 MG PO TABS: 40.0000 mg | ORAL_TABLET | Freq: Every day | ORAL | Status: DC

## 2013-07-11 MED ORDER — FLUTICASONE PROPIONATE 50 MCG/ACT NA SUSP: 2.0000 | Freq: Every day | NASAL | Status: DC

## 2013-07-11 NOTE — Discharge Instructions (Signed)
Use nasal saline (you can try [employer] Simply Saline) at least 4 times a day, use saline 5-10 minutes before using the fluticasone (flonase)  Do not use Afrin (Oxymetazoline)  Rest, wash hands frequently  and drink plenty of water.  Do not hesitate to return to the Emergency Department for any new, worsening or concerning symptoms.   If you do not have a primary care doctor you can establish one at the   [facility]: [address] [place] [address] [phone]  After you establish care. Let them know you were seen in the emergency room. They must obtain records for further management.     Bronchitis Bronchitis is inflammation of the airways that extend from the windpipe into the lungs (bronchi). The inflammation often causes mucus to develop, which leads to a cough. If the inflammation becomes severe, it may cause shortness of breath. CAUSES  Bronchitis may be caused by:   Viral infections.   Bacteria.   Cigarette smoke.   Allergens, pollutants, and other irritants.  SIGNS AND SYMPTOMS  The most common symptom of bronchitis is a frequent cough that produces mucus. Other symptoms include:  Fever.   Body aches.   Chest congestion.   Chills.   Shortness of breath.   Sore throat.  DIAGNOSIS  Bronchitis is usually diagnosed through a medical history and physical exam. Tests, such as chest X-rays, are sometimes done to rule out other conditions.  TREATMENT  You may need to avoid contact with whatever caused the problem (smoking, for example). Medicines are sometimes needed. These may include:  Antibiotics. These may be prescribed if the condition is caused by bacteria.  Cough suppressants. These may be prescribed for relief of cough symptoms.   Inhaled medicines. These may be prescribed to help open your airways and make it easier for you to breathe.   Steroid medicines. These may be prescribed for those with recurrent  (chronic) bronchitis. HOME CARE INSTRUCTIONS  Get plenty of rest.   Drink enough fluids to keep your urine clear or pale yellow (unless you have a medical condition that requires fluid restriction). Increasing fluids may help thin your secretions and will prevent dehydration.   Only take over-the-counter or prescription medicines as directed by your health care provider.  Only take antibiotics as directed. Make sure you finish them even if you start to feel better.  Avoid secondhand smoke, irritating chemicals, and strong fumes. These will make bronchitis worse. If you are a smoker, quit smoking. Consider using nicotine gum or skin patches to help control withdrawal symptoms. Quitting smoking will help your lungs heal faster.   Put a cool-mist humidifier in your bedroom at night to moisten the air. This may help loosen mucus. Change the water in the humidifier daily. You can also run the hot water in your shower and sit in the bathroom with the door closed for 5 10 minutes.   Follow up with your health care provider as directed.   Wash your hands frequently to avoid catching bronchitis again or spreading an infection to others.  SEEK MEDICAL CARE IF: Your symptoms do not improve after 1 week of treatment.  SEEK IMMEDIATE MEDICAL CARE IF:  Your fever increases.  You have chills.   You have chest pain.   You have worsening shortness of breath.   You have bloody sputum.  You faint.  You have lightheadedness.  You have a severe headache.   You vomit repeatedly. MAKE SURE YOU:  Understand these instructions.  Will watch your condition.  Will get help right away if you are not doing well or get worse. Document Released: 02/12/2005 Document Revised: 12/03/2012 Document Reviewed: 10/07/2012 [facility] Patient Information 2014 [place].

## 2013-07-11 NOTE — ED Provider Notes (Signed)
CSN: [id number]     Arrival date & time 07/11/13  1206 History   First MD Initiated Contact with Patient 07/11/13 1301     Chief Complaint  Patient presents with  . Shortness of Breath     (Consider location/radiation/quality/duration/timing/severity/associated sxs/prior Treatment) HPI  Jillian Weber is a 38 y.o. female with past medical history of obesity, bronchitis complaining of runny nose, sinus congestion, subjective fever and shortness of breath. Symptoms have been worsening since Tuesday, preceded by sinus pressure. No medication prior to arrival. Patient denies increasing peripheral edema, chest pain, abdominal pain, nausea, vomiting, cough.    Past Medical History  Diagnosis Date  . Obesity   . Bronchitis   . Hypertension   . Arthritis   . Carpal tunnel syndrome    Past Surgical History  Procedure Laterality Date  . Tonsillectomy    . Tonsillectomy    . Tubal ligation    . Wrist surgery      bilateral, carpal tunnel   Family History  Problem Relation Age of Onset  . Hypertension Mother   . Cancer Mother   . Coronary artery disease Mother   . Osteoarthritis Mother   . Diabetes Father   . Hypertension Father   . Asthma Father   . Asthma Sister   . Diabetes Sister    History  Substance Use Topics  . Smoking status: Current Every Day Smoker -- 0.50 packs/day for 10 years    Types: Cigarettes  . Smokeless tobacco: Not on file  . Alcohol Use: No   OB History   Grav Para Term Preterm Abortions TAB SAB Ect Mult Living                 Review of Systems  10 systems reviewed and found to be negative, except as noted in the HPI.   Allergies  Review of patient's allergies indicates no known allergies.  Home Medications   Prior to Admission medications   Medication Sig Start Date End Date Taking? Authorizing Provider  albuterol (PROVENTIL HFA;VENTOLIN HFA) 108 (90 BASE) MCG/ACT inhaler Inhale 1-2 puffs into the lungs every 6 (six) hours as needed  for wheezing or shortness of breath. 02/28/13   Heather Laisure, PA-C  guaiFENesin (ROBITUSSIN) 100 MG/5ML liquid Take 600 mg by mouth 3 (three) times daily as needed for cough or congestion.    Historical Provider, MD  HYDROcodone-homatropine (HYCODAN) 5-1.5 MG/5ML syrup Take 5 mLs by mouth every 6 (six) hours as needed for cough. 02/28/13   Heather Laisure, PA-C  predniSONE (DELTASONE) 20 MG tablet Take 3 tablets (60 mg total) by mouth daily. 02/28/13   Heather Laisure, PA-C   BP 152/99  Pulse 109  Temp(Src) 98 F (36.7 C) (Core (Comment))  Resp 22  SpO2 100%  LMP 06/21/2013 Physical Exam  Nursing note and vitals reviewed. Constitutional: She is oriented to person, place, and time. She appears well-developed and well-nourished. No distress.  Morbidly obese  HENT:  Head: Normocephalic and atraumatic.  Mouth/Throat: Oropharynx is clear and moist.  Eyes: Conjunctivae and EOM are normal. Pupils are equal, round, and reactive to light.  Neck: Normal range of motion.  Cardiovascular: Normal rate.   Pulmonary/Chest: Effort normal and breath sounds normal. No stridor.  Difficult to evaluate breath sounds secondary to habitus, no wheezing appreciated  Abdominal: Soft. There is no tenderness.  Musculoskeletal: Normal range of motion. She exhibits no tenderness.  Neurological: She is alert and oriented to person, place, and time.  Psychiatric:  She has a normal mood and affect.    ED Course  Procedures (including critical care time) Labs Review Labs Reviewed  BASIC METABOLIC PANEL - Abnormal; Notable for the following:    Glucose, Bld 178 (*)    BUN 5 (*)    Creatinine, Ser 0.49 (*)    All other components within normal limits  CBC - Abnormal; Notable for the following:    MCV 101.4 (*)    All other components within normal limits  PRO B NATRIURETIC PEPTIDE  I-STAT TROPOININ, ED    Imaging Review Dg Chest 2 View  07/11/2013   CLINICAL DATA:  Shortness of breath, cough and congestion   EXAM: CHEST  2 VIEW  COMPARISON:  02/28/2013  FINDINGS: Exam detail is suboptimal due to patient body habitus. Lungs are hypoaerated with crowding of the bronchovascular markings. Heart size at upper limits of normal. No focal pulmonary opacity. No pleural effusion. No acute osseous abnormality.  IMPRESSION: No active cardiopulmonary disease.   Electronically Signed   By: Christiana PellantGretchen  Green M.D.   On: 07/11/2013 15:19     EKG Interpretation None     Sinus tachycardia at 108 beats per minute, normal intervals and axis. Nonspecific ST-T wave changes.   MDM   Final diagnoses:  URI (upper respiratory infection)  Reactive airway disease    Filed Vitals:   07/11/13 1217  BP: 152/99  Pulse: 109  Temp: 98 F (36.7 C)  TempSrc: Core (Comment)  Resp: 22  SpO2: 100%    Medications  predniSONE (DELTASONE) tablet 60 mg (60 mg Oral Given 07/11/13 1330)  ipratropium-albuterol (DUONEB) 0.5-2.5 (3) MG/3ML nebulizer solution 3 mL (3 mLs Nebulization Given 07/11/13 1330)    Jillian Weber is a 38 y.o. female presenting with presenting with nasal congestion, tactile fever and shortness of breath. Likely reactive airway secondary to allergies, however, considering her risk factors, cardiac work up initiated.  EKG is nonischemic with no signs of LVH. BNP and troponin are negative, blood work otherwise unremarkable and chest x-ray also without significant abnormality. Patient reports subjective improvement after prednisone and nebulizer. We'll treat for acute bronchitis/sinusitis.  Evaluation does not show pathology that would require ongoing emergent intervention or inpatient treatment. Pt is hemodynamically stable and mentating appropriately. Discussed findings and plan with patient/guardian, who agrees with care plan. All questions answered. Return precautions discussed and outpatient follow up given.   New Prescriptions   ALBUTEROL (PROVENTIL HFA;VENTOLIN HFA) 108 (90 BASE) MCG/ACT INHALER     Inhale 1-2 puffs into the lungs every 4 (four) hours as needed for wheezing or shortness of breath.   AZITHROMYCIN (ZITHROMAX Z-PAK) 250 MG TABLET    2 po day one, then 1 daily x 4 days   FLUTICASONE (FLONASE) 50 MCG/ACT NASAL SPRAY    Place 2 sprays into both nostrils daily.   PREDNISONE (DELTASONE) 20 MG TABLET    Take 2 tablets (40 mg total) by mouth daily.    Note: Portions of this report may have been transcribed using voice recognition software. Every effort was made to ensure accuracy; however, inadvertent computerized transcription errors may be present    Wynetta Emeryicole Petrice Beedy, PA-C 07/11/13 1639

## 2013-07-11 NOTE — ED Notes (Signed)
Pt. Stated, I started having congestion and now Im Having SOB and sweating all over face and chest.

## 2013-07-12 NOTE — ED Provider Notes (Signed)
Medical screening examination/treatment/procedure(s) were performed by non-physician practitioner and as supervising physician I was immediately available for consultation/collaboration.   EKG Interpretation None        Layla MawKristen N Ward, DO 07/12/13 [id number]

## 2013-09-30 ENCOUNTER — Encounter (HOSPITAL_COMMUNITY): Payer: Self-pay | Admitting: Family Medicine

## 2013-09-30 ENCOUNTER — Emergency Department (INDEPENDENT_AMBULATORY_CARE_PROVIDER_SITE_OTHER)
Admission: EM | Admit: 2013-09-30 | Discharge: 2013-09-30 | Disposition: A | Discharge status: Home / Self Care | Payer: Self-pay | Source: Home / Self Care | Attending: Family Medicine | Admitting: Family Medicine

## 2013-09-30 DIAGNOSIS — H109 Unspecified conjunctivitis: Secondary | ICD-10-CM

## 2013-09-30 MED ORDER — POLYMYXIN B-TRIMETHOPRIM 10000-0.1 UNIT/ML-% OP SOLN: 1.0000 [drp] | OPHTHALMIC | Status: DC

## 2013-09-30 MED ORDER — TETRACAINE HCL 0.5 % OP SOLN
OPHTHALMIC | Status: AC
  Filled 2013-09-30: qty 2

## 2013-09-30 MED ORDER — OLOPATADINE HCL 0.2 % OP SOLN: 1.0000 [drp] | Freq: Every day | OPHTHALMIC | Status: DC

## 2013-09-30 NOTE — Discharge Instructions (Signed)
You likely have a mild bacterial infection of the right eye Please start the polytrim drops for the infection  Please use the pataday for redness relief Start ibuprofen 600mg  every 6 hours for swelling and relief

## 2013-09-30 NOTE — ED Notes (Signed)
Pt  Has  Irritated  Watery   r  Eye      For  Several  Days          Pt  Has  Been  Applying   Cold  Wash  Cloth   To  The  Eye          And  Continues  To  Have  Symptoms

## 2013-09-30 NOTE — ED Provider Notes (Signed)
CSN: [id number]     Arrival date & time 09/30/13  1044 History   First MD Initiated Contact with Patient 09/30/13 1106     Chief Complaint  Patient presents with  . Eye Problem   (Consider location/radiation/quality/duration/timing/severity/associated sxs/prior Treatment) HPI  R eye irritation: burning. Started on Monday. Cold compress w/ some benefit. Getting worse. Increased tear production. Denies anvisual injury. Feels R eye is blurry. No trauma. Swollen intermittently. Tylenol w/o benefit.    Past Medical History  Diagnosis Date  . Obesity   . Bronchitis   . Hypertension   . Arthritis   . Carpal tunnel syndrome    Past Surgical History  Procedure Laterality Date  . Tonsillectomy    . Tonsillectomy    . Tubal ligation    . Wrist surgery      bilateral, carpal tunnel   Family History  Problem Relation Age of Onset  . Hypertension Mother   . Cancer Mother   . Coronary artery disease Mother   . Osteoarthritis Mother   . Diabetes Father   . Hypertension Father   . Asthma Father   . Asthma Sister   . Diabetes Sister    History  Substance Use Topics  . Smoking status: Current Every Day Smoker -- 0.50 packs/day for 10 years    Types: Cigarettes  . Smokeless tobacco: Not on file  . Alcohol Use: Yes     Comment: occ   OB History   Grav Para Term Preterm Abortions TAB SAB Ect Mult Living                 Review of Systems Per HPI with all other pertinent systems negative.   Allergies  Review of patient's allergies indicates no known allergies.  Home Medications   Prior to Admission medications   Medication Sig Start Date End Date Taking? Authorizing Provider  albuterol (PROVENTIL HFA;VENTOLIN HFA) 108 (90 BASE) MCG/ACT inhaler Inhale 1-2 puffs into the lungs every 6 (six) hours as needed for wheezing or shortness of breath. 02/28/13   Heather Laisure, PA-C  albuterol (PROVENTIL HFA;VENTOLIN HFA) 108 (90 BASE) MCG/ACT inhaler Inhale 1-2 puffs into the lungs every  4 (four) hours as needed for wheezing or shortness of breath. 07/11/13   Nicole Pisciotta, PA-C  azithromycin (ZITHROMAX Z-PAK) 250 MG tablet 2 po day one, then 1 daily x 4 days 07/11/13   Joni Reining Pisciotta, PA-C  fluticasone (FLONASE) 50 MCG/ACT nasal spray Place 2 sprays into both nostrils daily. 07/11/13   Nicole Pisciotta, PA-C  guaiFENesin (ROBITUSSIN) 100 MG/5ML liquid Take 600 mg by mouth 3 (three) times daily as needed for cough or congestion.    Historical Provider, MD  loratadine (CLARITIN) 10 MG tablet Take 10 mg by mouth daily.    Historical Provider, MD  Olopatadine HCl 0.2 % SOLN Apply 1 drop to eye daily. 09/30/13   Ozella Rocks, MD  predniSONE (DELTASONE) 20 MG tablet Take 2 tablets (40 mg total) by mouth daily. 07/11/13   Nicole Pisciotta, PA-C  trimethoprim-polymyxin b (POLYTRIM) ophthalmic solution Place 1-2 drops into the right eye every 4 (four) hours. 09/30/13   Ozella Rocks, MD   BP 147/97  Pulse 96  Temp(Src) 98.2 F (36.8 C) (Oral)  Resp 18  SpO2 92%  LMP 09/19/2013 Physical Exam  Constitutional: She appears well-developed and well-nourished. No distress.  Morbidly obese  HENT:  Head: Normocephalic and atraumatic.  Eyes:  EOMI, PERRL, R eye under fluorcein exam and lighting w/o  abrasion .   Neck: Normal range of motion.  Cardiovascular: Normal rate and normal heart sounds.   Pulmonary/Chest: Effort normal and breath sounds normal.  Musculoskeletal: Normal range of motion. She exhibits no edema and no tenderness.  Neurological: She is alert.  Skin: Skin is warm and dry. She is not diaphoretic.  Psychiatric: She has a normal mood and affect. Her behavior is normal. Judgment and thought content normal.    ED Course  Procedures (including critical care time) Labs Review Labs Reviewed - No data to display  Imaging Review No results found.   MDM   1. Conjunctivitis of right eye    Bacterial conjunctivitis. No obvious corneal abrasion.  - NSAIDs prn pain   - Precautions given and all questions answered  Shelly Flattenavid Merrell, MD Family Medicine 09/30/2013, 11:37 AM      Ozella Rocksavid J Merrell, MD 09/30/13 [phone]

## 2014-03-11 ENCOUNTER — Encounter (HOSPITAL_COMMUNITY): Payer: Self-pay | Admitting: Emergency Medicine

## 2014-03-11 ENCOUNTER — Emergency Department (INDEPENDENT_AMBULATORY_CARE_PROVIDER_SITE_OTHER)
Admission: EM | Admit: 2014-03-11 | Discharge: 2014-03-11 | Disposition: A | Discharge status: Home / Self Care | Payer: Self-pay | Source: Home / Self Care | Attending: Family Medicine | Admitting: Family Medicine

## 2014-03-11 DIAGNOSIS — IMO0001 Reserved for inherently not codable concepts without codable children: Secondary | ICD-10-CM

## 2014-03-11 DIAGNOSIS — R350 Frequency of micturition: Secondary | ICD-10-CM

## 2014-03-11 DIAGNOSIS — M791 Myalgia: Secondary | ICD-10-CM

## 2014-03-11 DIAGNOSIS — M7918 Myalgia, other site: Secondary | ICD-10-CM

## 2014-03-11 DIAGNOSIS — S29011A Strain of muscle and tendon of front wall of thorax, initial encounter: Secondary | ICD-10-CM

## 2014-03-11 DIAGNOSIS — S29091A Other injury of muscle and tendon of front wall of thorax, initial encounter: Secondary | ICD-10-CM

## 2014-03-11 DIAGNOSIS — N390 Urinary tract infection, site not specified: Secondary | ICD-10-CM

## 2014-03-11 LAB — POCT URINALYSIS DIP (DEVICE)
Bilirubin Urine: NEGATIVE
GLUCOSE, UA: NEGATIVE mg/dL
KETONES UR: NEGATIVE mg/dL
Nitrite: NEGATIVE
Protein, ur: NEGATIVE mg/dL
Specific Gravity, Urine: 1.02 (ref 1.005–1.030)
Urobilinogen, UA: 0.2 mg/dL (ref 0.0–1.0)
pH: 7 (ref 5.0–8.0)

## 2014-03-11 MED ORDER — FLUCONAZOLE 150 MG PO TABS: 150.0000 mg | ORAL_TABLET | Freq: Every day | ORAL | Status: DC

## 2014-03-11 MED ORDER — CEPHALEXIN 500 MG PO CAPS: 500.0000 mg | ORAL_CAPSULE | Freq: Three times a day (TID) | ORAL | Status: DC

## 2014-03-11 MED ORDER — DICLOFENAC SODIUM 75 MG PO TBEC: 75.0000 mg | DELAYED_RELEASE_TABLET | Freq: Two times a day (BID) | ORAL | Status: DC

## 2014-03-11 NOTE — Discharge Instructions (Signed)
You have developed a pectoral muscle strain Please start the voltaren twice a day and stretch the arm multiple times per day Please start the keflex if your symptoms do not improve in another 1-2 days Pectoralis Major Rupture with Rehab  The pectoralis major is the main muscle of the chest. It is responsible for bringing the arm across the body and for rotating the arm inward. A pectoralis major rupture is a tear in the tendon that attaches the chest muscle to the upper arm bone (humerus). When the tendon is torn, there is a loss of connection between the muscle and the bone. Pectoralis major ruptures usually involve the tendon pulling off from the arm bone, although sometimes the muscle may tear in the mid-belly or at the point where the muscle becomes tendon. SYMPTOMS   A "pop" or tearing, and severe sharp, often burning, pain in the chest at the time of injury.  Tenderness, swelling, warmth, or redness and later bruising (contusion) over and around the pectoralis muscle-tendon, chest, and armpit region.  Pain and weakness when trying to use the chest muscle.  Loss of shape of the armpit region, especially when pushing your hands together in front of your body.  Loss of firm fullness, when pushing on the area where the tendon ruptured (a defect between the ends of the tendon and bone where they separated from each other). CAUSES  The pectoralis major muscle or tendon tears when a force is placed on it that is greater than it can handle. Common causes of injury include:  Sudden episode of stressful over-activity.  Direct hit (trauma) to the chest.  Fall from a height. RISK INCREASES WITH:  Sports that require excessive muscle stress, (weightlifting).  Contact sports with minimal protective devices for the chest.  Wrestling.  Poor strength and flexibility.  Previous injury to the chest muscle.  Untreated pectoralis major tendinitis.  Corticosteroid injection into the pectoralis  major tendon. (Corticosteroid injections damage tendons.)  Oral anabolic steroid use. PREVENTION  Warm up and stretch properly before activity.  Allow for adequate recovery between workouts.  Maintain physical fitness:  Strength, flexibility, and endurance.  Cardiovascular fitness. PROGNOSIS  If treated properly, pectoralis major ruptures are usually curable, with a return to sports in 6 to 9 months.  RELATED COMPLICATIONS   Weakness of the pectoralis major, especially if left untreated.  Re-rupture of the tendon after treatment.  Prolonged disability.  Risks of surgery: infection, injury to nerves (numbness, weakness, or paralysis), bleeding, hematoma (blood clot), pseudocyst (collection of fluid), shoulder stiffness, shoulder weakness, and pain with strenuous activity.  Loss of chest or armpit shape.  Inability to repair rupture. TREATMENT Treatment first involves resting from any activities that aggravate the symptoms. The use of ice and medicine will help reduce pain and inflammation. The use of strengthening and stretching exercises may help reduce pain with activity. These exercises may be performed at home. However, referral to a therapist may be advised for further evaluation and treatment, such as ultrasound therapy. If the rupture occurs in the muscle or the muscle-tendon juncture, surgery repair is not possible. However, for tears that occur at the attachment site of the arm bone, surgery may be advised. Without surgery, the loss of normal armpit shape and weakness of the shoulder will persist. For the best chance of a successful outcome, surgery must be performed within the first few weeks after injury. After surgery, the chest and shoulder of the affected side are restrained, to allow for healing.  After restraint, it is important to perform strengthening and stretching exercises to help regain strength and a full range of motion.  MEDICATION   If pain medicine is needed,  nonsteroidal anti-inflammatory medicines (aspirin and ibuprofen), or other minor pain relievers (acetaminophen), are often advised.  Do not take pain medicine for 7 days before surgery.  Prescription pain relievers may be given, if your caregiver thinks they are needed. Use only as directed and only as much as you need. COLD THERAPY  Cold treatment (icing) should be applied for 10 to 15 minutes every 2 to 3 hours for inflammation and pain, and immediately after activity that aggravates your symptoms. Use ice packs or an ice massage. SEEK MEDICAL CARE IF:  Pain increases, despite treatment.  Any of the following occur after surgery: signs of infection, including fever, increased pain, swelling, redness, drainage of fluids, or bleeding in the affected area.  New, unexplained symptoms develop. (Drugs used in treatment may produce side effects.) EXERCISES RANGE OF MOTION (ROM) AND STRETCHING EXERCISES - Pectoralis Major Rupture These exercises may help you when beginning to rehabilitate your injury. Your symptoms may resolve with or without further involvement from your physician, physical therapist or athletic trainer. While completing these exercises, remember:   Restoring tissue flexibility helps normal motion to return to the joints. This allows healthier, less painful movement and activity.  An effective stretch should be held for at least 30 seconds.  A stretch should never be painful. You should only feel a gentle lengthening or release in the stretched tissue. ROM - Pendulum  Bend at the waist so that your right / left arm falls away from your body. Support yourself with your opposite hand on a solid surface, such as a table or a countertop.  Your right / left arm should be perpendicular to the ground. If it is not perpendicular, you need to lean over farther. Relax the muscles in your right / left arm and shoulder as much as possible.  Gently sway your hips and trunk so they move  your right / left arm without any use of your right / left shoulder muscles.  Progress your movements so that your right / left arm moves side to side, then forward and backward, and finally, both clockwise and counterclockwise.  Complete __________ repetitions in each direction. Many people use this exercise to relieve discomfort in their shoulder, as well as to gain range of motion. Repeat __________ times. Complete this exercise __________ times per day. STRETCH - Flexion, Standing  Stand with good posture. With an underhand grip on your right / left and an overhand grip on the opposite hand, grasp a broomstick or cane so that your hands are a little more than shoulder width apart.  Keeping your right / left elbow straight and shoulder muscles relaxed, push the stick with your opposite hand to raise your right / left arm in front of your body and then overhead. Raise your arm until you feel a stretch in your right / left shoulder, but before you have increased shoulder pain.  Try to avoid shrugging your right / left shoulder as your arm rises, by keeping your shoulder blade tucked down and toward your mid-back spine. Hold __________ seconds.  Slowly return to the starting position. Repeat __________ times. Complete this exercise __________ times per day.  STRETCH - Abduction, Supine  Lie on your back. With an underhand grip on your right / left hand and an overhand grip on the opposite hand,  grasp a broomstick or cane so that your hands are a little more than shoulder width apart.  Keeping your right / left elbow straight and shoulder muscles relaxed, push the stick with your opposite hand to raise your right / left arm out to the side of your body and then overhead. Raise your arm until you feel a stretch in your right / left shoulder, but before you have increased shoulder pain.  Try to avoid shrugging your right / left shoulder as your arm rises, by keeping your shoulder blade tucked down  and toward your mid-back spine. Hold __________ seconds.  Slowly return to the starting position. Repeat __________ times. Complete this exercise __________ times per day.  ROM - Flexion, Active-Assisted  Lie on your back. You may bend your knees for comfort.  Grasp a broomstick or cane so your hands are about shoulder width apart. Your right / left hand should grip the end of the stick, so that your hand is positioned "thumbs-up," as if you were about to shake hands.  Using your healthy arm to lead, raise your right / left arm overhead until you feel a gentle stretch in your shoulder. Hold __________ seconds.  Use the stick to assist in returning your right / left arm to its starting position. Repeat __________ times. Complete this exercise __________ times per day.  STRETCH - External Rotation and Abductio  Stagger your stance through a doorframe. It does not matter which foot is forward.  Choose one of the following positions as instructed by your physician, physical therapist or athletic trainer. Place your hands:  and forearms above your head and on the door frame.  and forearms at head height and on the door frame.  at elbow height and on the door frame.  Keeping your head and chest upright and your stomach muscles tight to prevent over-extending your low-back, slowly shift your weight onto your front foot until you feel a stretch across your chest and in the front of your shoulders.  Hold __________ seconds. Shift your weight to your back foot to release the stretch. Repeat __________ times. Complete this stretch __________ times per day.  STRENGTHENING EXERCISES - Pectoralis Major Rupture  These exercises may help you when beginning to rehabilitate your injury. They may resolve your symptoms with or without further involvement from your physician, physical therapist or athletic trainer. While completing these exercises, remember:   Muscles can gain both the endurance and the  strength needed for everyday activities through controlled exercises.  Complete these exercises as instructed by your physician, physical therapist or athletic trainer. Increase the resistance and repetitions only as guided by your caregiver.  You may experience muscle soreness or fatigue, but the pain or discomfort you are trying to eliminate should never worsen during these exercises. If this pain does worsen, stop and make certain you are following the directions exactly. If the pain is still present after adjustments, discontinue the exercise until you can discuss the trouble with your caregiver. STRENGTH - Scapular Protractors, Standing  Stand arms length away from a wall. Place your hands on the wall, keeping your elbows straight.  Begin by dropping your shoulder blades down and toward your mid-back spine.  To strengthen your protractors, keep your shoulder blades down, but slide them forward on your rib cage. It will feel as if you are lifting the back of your rib cage away from the wall. This is a subtle motion and can be challenging to complete. Ask your  caregiver for further instruction, if you are not sure you are doing the exercise correctly.  Hold for __________ seconds. Slowly return to the starting position, resting the muscles completely before starting the next repetition. Repeat __________ times. Complete this exercise __________ times per day. STRENGTH - Horizontal Abductors Choose one of the two positions to complete this exercise. Prone: lying on stomach:  Lie on your stomach on a firm surface so that your right / left arm overhangs the edge. Rest your forehead on your opposite forearm. With your palm facing the floor and your elbow straight, hold a __________ weight in your hand.  Squeeze your right / left shoulder blade to your mid-back spine and then slowly raise your arm to the height of the bed.  Hold for __________ seconds. Slowly reverse the directions and return to  the starting position, controlling the weight as you lower your arm. Repeat __________ times. Complete this exercise __________ times per day. Standing:   Secure a rubber exercise band or tubing to a fixed object (table, pole) so that it is at the height of your shoulders when you are either standing, or sitting on a firm armless chair.  Grasp an end of the band in each hand and have your palms face each other. Straighten your elbows and lift your hands straight in front of you at shoulder height. Step back away from the secured end of band until it becomes tense.  Squeeze your shoulder blades together. Keeping your elbows locked and your hands at shoulder height, bring your hands out to your sides.  Hold __________ seconds. Slowly ease the tension on the band, as you reverse the directions and return to the starting position. Repeat __________ times. Complete this exercise __________ times per day. STRENGTH - Scapular Protractors, Quadruped  Get onto your hands and knees with your shoulders directly over your hands (or as close as you can comfortably be).  Keeping your elbows locked, lift the back of your rib cage up into your shoulder blades so your mid-back rounds out. Keep your neck muscles relaxed.  Hold this position for __________ seconds. Slowly return to the starting position and allow your muscles to relax completely before starting the next repetition. Repeat __________ times. Complete this exercise __________ times per day.  STRENGTH - Scapular Depressors  Find a sturdy chair without wheels, such as a dining table chair, and place your hands on the armrests.  Keeping your feet on the floor, lift your bottom from the seat and lock your elbows.  Keeping your elbows straight, allow gravity to pull your body weight down. Your shoulders will rise toward your ears.  Raise your body against gravity by drawing your shoulder blades down your back, shortening the distance between your  shoulders and ears. Although your feet should always maintain contact with the floor, your feet should progressively support less body weight as you get stronger.  Hold __________ seconds. In a controlled and slow manner, lower your body weight to begin the next repetition. Repeat __________ times. Complete this exercise __________ times per day.  STRENGTH - Scapular Protractors, Supine  Lie on your back on a firm surface. Extend your right / left arm straight into the air while holding a __________ weight in your hand.  Keeping your head and back in place, lift your shoulder off the floor.  Hold __________ seconds. Slowly return to the starting position and allow your muscles to relax completely before starting the next repetition. Repeat __________ times. Complete  this exercise __________ times per day. Document Released: 02/12/2005 Document Revised: 05/07/2011 Document Reviewed: 05/27/2008 [facility] Patient Information 2015 [place], [place]. This information is not intended to replace advice given to you by your health care provider. Make sure you discuss any questions you have with your health care provider.

## 2014-03-11 NOTE — ED Notes (Signed)
Pt reports pain in her left shoulder x2 weeks.  It hurts at rest and is very difficult to move.  She also states her left side began hurting this morning.  She reports some frequency with urination the last two weeks, but no pain, urgency or odor with urination.

## 2014-03-11 NOTE — ED Provider Notes (Signed)
CSN: [id number]     Arrival date & time 03/11/14  1422 History   None    Chief Complaint  Patient presents with  . Shoulder Pain    left  . Flank Pain    left  . Dysuria   (Consider location/radiation/quality/duration/timing/severity/associated sxs/prior Treatment) HPI  L shoulder pain: started 2 weeks ago. Started after going to the gym again and lifting weights. Pain actually not located in the shoulder but in the L upper chest and is worse lifting arm and palpation. No swelling no numbness or tingling. naproxyn w/ some benefit  L flank pain: associated w/ frequency. Denies dysuria, abd pain, fevers, vaginal discharge. Started today. 6 x today. Denies recent antibiotics or change in sexual partners.    Tobacco: smoking 1ppd but would like to quit  Past Medical History  Diagnosis Date  . Obesity   . Bronchitis   . Hypertension   . Arthritis   . Carpal tunnel syndrome    Past Surgical History  Procedure Laterality Date  . Tonsillectomy    . Tonsillectomy    . Tubal ligation    . Wrist surgery      bilateral, carpal tunnel   Family History  Problem Relation Age of Onset  . Hypertension Mother   . Cancer Mother   . Coronary artery disease Mother   . Osteoarthritis Mother   . Diabetes Father   . Hypertension Father   . Asthma Father   . Asthma Sister   . Diabetes Sister    History  Substance Use Topics  . Smoking status: Current Every Day Smoker -- 1.00 packs/day for 10 years    Types: Cigarettes  . Smokeless tobacco: Not on file  . Alcohol Use: Yes     Comment: occ   OB History    No data available     Review of Systems Per HPI with all other pertinent systems negative.     Allergies  Review of patient's allergies indicates no known allergies.  Home Medications   Prior to Admission medications   Medication Sig Start Date End Date Taking? Authorizing Provider  albuterol (PROVENTIL HFA;VENTOLIN HFA) 108 (90 BASE) MCG/ACT inhaler Inhale 1-2 puffs  into the lungs every 6 (six) hours as needed for wheezing or shortness of breath. 02/28/13   Heather Laisure, PA-C  albuterol (PROVENTIL HFA;VENTOLIN HFA) 108 (90 BASE) MCG/ACT inhaler Inhale 1-2 puffs into the lungs every 4 (four) hours as needed for wheezing or shortness of breath. 07/11/13   Nicole Pisciotta, PA-C  azithromycin (ZITHROMAX Z-PAK) 250 MG tablet 2 po day one, then 1 daily x 4 days 07/11/13   Joni Reining Pisciotta, PA-C  cephALEXin (KEFLEX) 500 MG capsule Take 1 capsule (500 mg total) by mouth 3 (three) times daily. 03/11/14   Ozella Rocks, MD  diclofenac (VOLTAREN) 75 MG EC tablet Take 1 tablet (75 mg total) by mouth 2 (two) times daily. 03/11/14   Ozella Rocks, MD  fluconazole (DIFLUCAN) 150 MG tablet Take 1 tablet (150 mg total) by mouth daily. Repeat dose in 3 days 03/11/14   Ozella Rocks, MD  fluticasone [facility]) 50 MCG/ACT nasal spray Place 2 sprays into both nostrils daily. 07/11/13   Nicole Pisciotta, PA-C  guaiFENesin (ROBITUSSIN) 100 MG/5ML liquid Take 600 mg by mouth 3 (three) times daily as needed for cough or congestion.    Historical Provider, MD  loratadine (CLARITIN) 10 MG tablet Take 10 mg by mouth daily.    Historical Provider, MD  Olopatadine HCl 0.2 % SOLN Apply 1 drop to eye daily. 09/30/13   Ozella Rocksavid J Miya Luviano, MD  predniSONE (DELTASONE) 20 MG tablet Take 2 tablets (40 mg total) by mouth daily. 07/11/13   Nicole Pisciotta, PA-C  trimethoprim-polymyxin b (POLYTRIM) ophthalmic solution Place 1-2 drops into the right eye every 4 (four) hours. 09/30/13   Ozella Rocksavid J Nehan Flaum, MD   BP 122/88 mmHg  Pulse 92  Temp(Src) 98.1 F (36.7 C) (Oral)  Resp 24  SpO2 96%  LMP 02/09/2014 (Approximate) Physical Exam  Constitutional: She is oriented to person, place, and time.  Morbidly obese  HENT:  Head: Normocephalic and atraumatic.  Eyes: EOM are normal. Pupils are equal, round, and reactive to light.  Neck: Normal range of motion.  Cardiovascular: Normal rate.   No murmur  heard. Pulmonary/Chest: Effort normal and breath sounds normal.  Abdominal: Soft. She exhibits no distension.  Musculoskeletal:  L pectoralis muscle tenderness. No shoulder abnormality. FROM. Pain w/ forward pushing against resistance.  L side moveemtn w/ movement.   Neurological: She is alert and oriented to person, place, and time.  Skin: Skin is warm.  Psychiatric: She has a normal mood and affect. Her behavior is normal. Thought content normal.    ED Course  Procedures (including critical care time) Labs Review Labs Reviewed  POCT URINALYSIS DIP (DEVICE) - Abnormal; Notable for the following:    Hgb urine dipstick SMALL (*)    Leukocytes, UA SMALL (*)    All other components within normal limits  URINE CULTURE    Imaging Review No results found.   MDM   1. Pectoralis muscle strain, initial encounter   2. Musculoskeletal pain   3. UTI (lower urinary tract infection)   4. Frequency    Voltaren Stretching Flank pain and questionable UA make UTI possible Keflex if symptoms do not improve UCX sent Precautions given and all questions answered  Shelly Flattenavid Brenna Friesenhahn, MD Family Medicine 03/11/2014, 3:44 PM     Ozella Rocksavid J Olyver Hawes, MD 03/11/14 [phone]

## 2014-03-13 LAB — URINE CULTURE: Colony Count: 50000

## 2014-04-16 IMAGING — CR DG CHEST 2V
2 series · 2 of 2 positions shown · non-contrast
Comparison: 03/07/2009

CLINICAL DATA: Cough

CHEST - 2 VIEW

[view not recorded (1 of 2)]
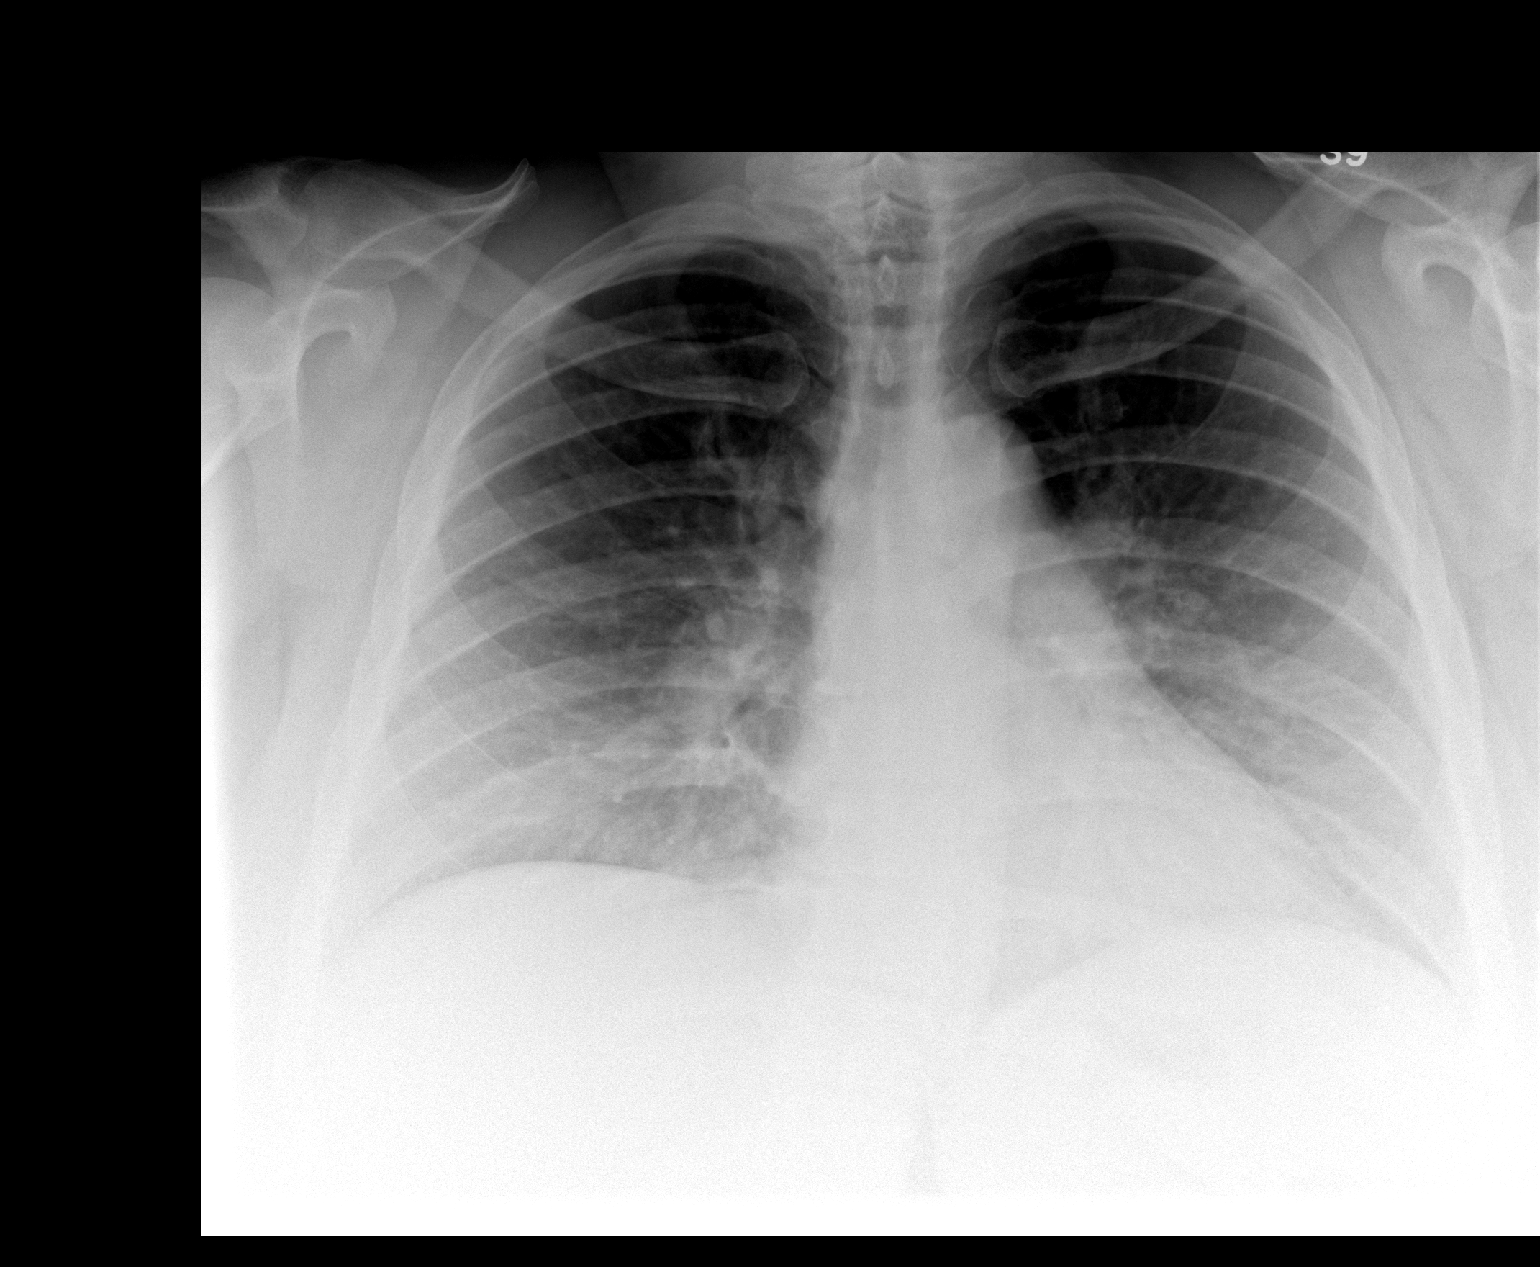

[view not recorded (2 of 2)]
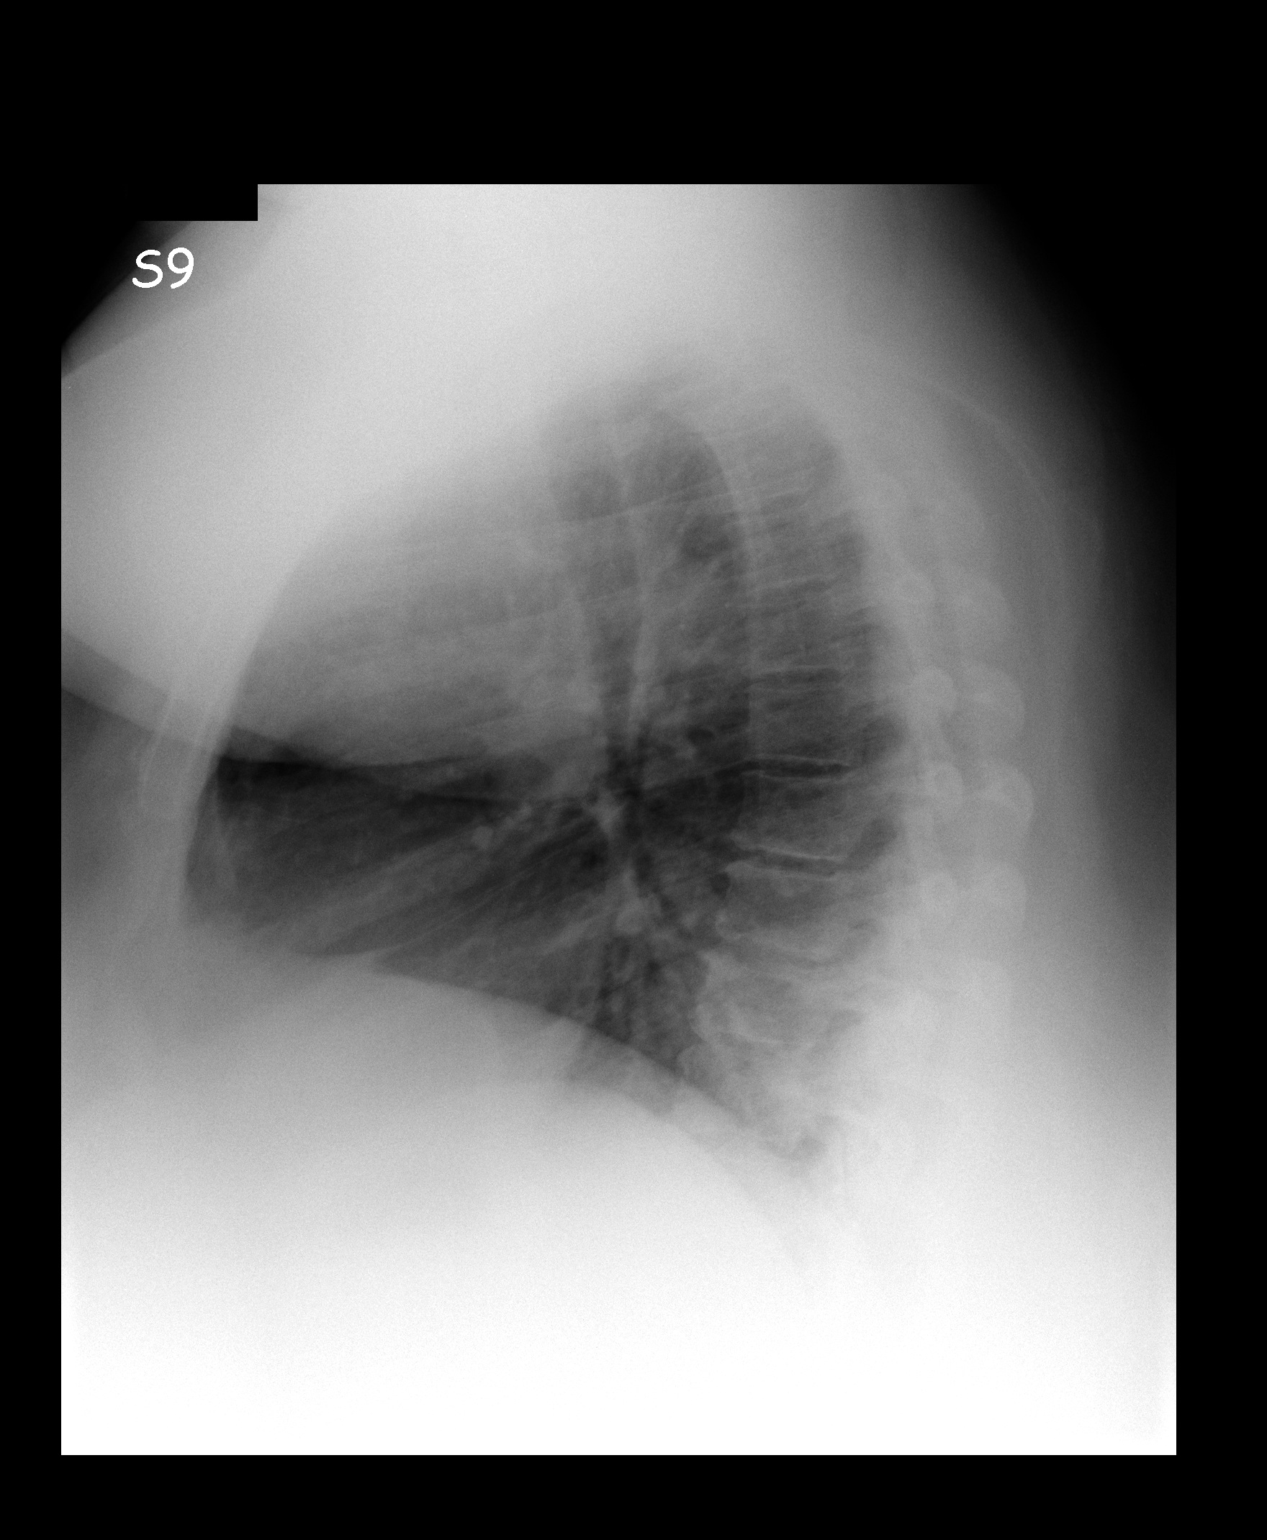

[2 of 2 positions shown; findings below may reference images not displayed]

FINDINGS: Low volumes.  Bibasilar atelectasis.  Normal heart size.
No consolidation or mass.  No pleural effusion.  No pneumothorax.
IMPRESSION: Bibasilar atelectasis.

## 2014-04-18 ENCOUNTER — Emergency Department (HOSPITAL_COMMUNITY)
Admission: EM | Admit: 2014-04-18 | Discharge: 2014-04-18 | Disposition: A | Discharge status: Home / Self Care | Payer: Self-pay | Attending: Emergency Medicine | Admitting: Emergency Medicine

## 2014-04-18 ENCOUNTER — Emergency Department (HOSPITAL_COMMUNITY): Payer: Self-pay

## 2014-04-18 ENCOUNTER — Encounter (HOSPITAL_COMMUNITY): Payer: Self-pay | Admitting: *Deleted

## 2014-04-18 DIAGNOSIS — Z8669 Personal history of other diseases of the nervous system and sense organs: Secondary | ICD-10-CM | POA: Insufficient documentation

## 2014-04-18 DIAGNOSIS — J40 Bronchitis, not specified as acute or chronic: Secondary | ICD-10-CM | POA: Insufficient documentation

## 2014-04-18 DIAGNOSIS — Z79899 Other long term (current) drug therapy: Secondary | ICD-10-CM | POA: Insufficient documentation

## 2014-04-18 DIAGNOSIS — Z720 Tobacco use: Secondary | ICD-10-CM | POA: Insufficient documentation

## 2014-04-18 DIAGNOSIS — I10 Essential (primary) hypertension: Secondary | ICD-10-CM | POA: Insufficient documentation

## 2014-04-18 DIAGNOSIS — Z7951 Long term (current) use of inhaled steroids: Secondary | ICD-10-CM | POA: Insufficient documentation

## 2014-04-18 MED ORDER — IBUPROFEN 600 MG PO TABS: 600.0000 mg | ORAL_TABLET | Freq: Four times a day (QID) | ORAL | Status: DC | PRN

## 2014-04-18 MED ORDER — GUAIFENESIN 100 MG/5ML PO SYRP: 100.0000 mg | ORAL_SOLUTION | ORAL | Status: DC | PRN

## 2014-04-18 MED ORDER — IPRATROPIUM-ALBUTEROL 0.5-2.5 (3) MG/3ML IN SOLN
3.0000 mL | RESPIRATORY_TRACT | Status: DC
  Administered 2014-04-18: 3 mL via RESPIRATORY_TRACT
  Filled 2014-04-18: qty 3

## 2014-04-18 MED ORDER — ALBUTEROL SULFATE HFA 108 (90 BASE) MCG/ACT IN AERS: 2.0000 | INHALATION_SPRAY | RESPIRATORY_TRACT | Status: DC | PRN

## 2014-04-18 NOTE — ED Notes (Signed)
Patient transported to X-ray 

## 2014-04-18 NOTE — ED Notes (Signed)
Pt reports productive cough and sob for several days, denies fever. Airway intact.

## 2014-04-18 NOTE — Discharge Instructions (Signed)
Adenovirus Adenoviruses are viruses that usually cause breathing problems. They may also cause other illnesses, such as stomach flu, bladder infection, and rashes. CAUSES  Adenoviruses are passed by direct contact. This can happen from touching the contaminated hands of someone who has just gone to the bathroom. It can also be passed through contaminated water. 1. You may have the virus and give it to others without being sick yourself. 2. Some types of this virus occur naturally in most parts of the world. Most of these infections occur in children. 3. Epidemics are often centered around swimming pools and small lakes. Symptoms can include fever and pink eye. 4. Adenovirus 7 is a specific virus gotten by breathing in the virus. It typically causes severe problems in the breathing system. Patients who get the adenovirus by the mouth usually have less severe symptoms. Adenovirus caught by breathing in the virus is more common in the late winter, spring, and early summer. SYMPTOMS  Symptoms vary and can include:  1. Common cold symptoms. 2. Pneumonia. 3. Croup. 4. Bronchitis. Patients with HIV, transplant patients, and some cancer patients are more likely to have severe problems. Acute respiratory disease (ARD) can be caused by adenovirus in crowded conditions.  These viruses are not easily killed with common cleaning products. DIAGNOSIS  Blood tests can be used to identify the problem.  TREATMENT  Most infections are mild and require no therapy. The symptoms can be treated to make the patient comfortable.  Document Released: 05/05/2002 Document Revised: 05/07/2011 Document Reviewed: 06/17/2013 [facility] Patient Information 2015 [place], [place]. This information is not intended to replace advice given to you by your health care provider. Make sure you discuss any questions you have with your health care provider.  How to Use an Inhaler Proper inhaler technique is very important. Good technique  ensures that the medicine reaches the lungs. Poor technique results in depositing the medicine on the tongue and back of the throat rather than in the airways. If you do not use the inhaler with good technique, the medicine will not help you. STEPS TO FOLLOW IF USING AN INHALER WITHOUT AN EXTENSION TUBE 5. Remove the cap from the inhaler. 6. If you are using the inhaler for the first time, you will need to prime it. Shake the inhaler for 5 seconds and release four puffs into the air, away from your face. Ask your health care provider or pharmacist if you have questions about priming your inhaler. 7. Shake the inhaler for 5 seconds before each breath in (inhalation). 8. Position the inhaler so that the top of the canister faces up. 9. Put your index finger on the top of the medicine canister. Your thumb supports the bottom of the inhaler. 10. Open your mouth. 11. Either place the inhaler between your teeth and place your lips tightly around the mouthpiece, or hold the inhaler 1-2 inches away from your open mouth. If you are unsure of which technique to use, ask your health care provider. 12. Breathe out (exhale) normally and as completely as possible. 13. Press the canister down with your index finger to release the medicine. 14. At the same time as the canister is pressed, inhale deeply and slowly until your lungs are completely filled. This should take 4-6 seconds. Keep your tongue down. 15. Hold the medicine in your lungs for 5-10 seconds (10 seconds is best). This helps the medicine get into the small airways of your lungs. 16. Breathe out slowly, through pursed lips. Whistling is an  example of pursed lips. 17. Wait at least 15-30 seconds between puffs. Continue with the above steps until you have taken the number of puffs your health care provider has ordered. Do not use the inhaler more than your health care provider tells you. 18. Replace the cap on the inhaler. 19. Follow the directions from  your health care provider or the inhaler insert for cleaning the inhaler. STEPS TO FOLLOW IF USING AN INHALER WITH AN EXTENSION (SPACER) 5. Remove the cap from the inhaler. 6. If you are using the inhaler for the first time, you will need to prime it. Shake the inhaler for 5 seconds and release four puffs into the air, away from your face. Ask your health care provider or pharmacist if you have questions about priming your inhaler. 7. Shake the inhaler for 5 seconds before each breath in (inhalation). 8. Place the open end of the spacer onto the mouthpiece of the inhaler. 9. Position the inhaler so that the top of the canister faces up and the spacer mouthpiece faces you. 10. Put your index finger on the top of the medicine canister. Your thumb supports the bottom of the inhaler and the spacer. 11. Breathe out (exhale) normally and as completely as possible. 12. Immediately after exhaling, place the spacer between your teeth and into your mouth. Close your lips tightly around the spacer. 13. Press the canister down with your index finger to release the medicine. 14. At the same time as the canister is pressed, inhale deeply and slowly until your lungs are completely filled. This should take 4-6 seconds. Keep your tongue down and out of the way. 15. Hold the medicine in your lungs for 5-10 seconds (10 seconds is best). This helps the medicine get into the small airways of your lungs. Exhale. 16. Repeat inhaling deeply through the spacer mouthpiece. Again hold that breath for up to 10 seconds (10 seconds is best). Exhale slowly. If it is difficult to take this second deep breath through the spacer, breathe normally several times through the spacer. Remove the spacer from your mouth. 17. Wait at least 15-30 seconds between puffs. Continue with the above steps until you have taken the number of puffs your health care provider has ordered. Do not use the inhaler more than your health care provider tells  you. 18. Remove the spacer from the inhaler, and place the cap on the inhaler. 19. Follow the directions from your health care provider or the inhaler insert for cleaning the inhaler and spacer. If you are using different kinds of inhalers, use your quick relief medicine to open the airways 10-15 minutes before using a steroid if instructed to do so by your health care provider. If you are unsure which inhalers to use and the order of using them, ask your health care provider, nurse, or respiratory therapist. If you are using a steroid inhaler, always rinse your mouth with water after your last puff, then gargle and spit out the water. Do not swallow the water. AVOID:  Inhaling before or after starting the spray of medicine. It takes practice to coordinate your breathing with triggering the spray.  Inhaling through the nose (rather than the mouth) when triggering the spray. HOW TO DETERMINE IF YOUR INHALER IS FULL OR NEARLY EMPTY You cannot know when an inhaler is empty by shaking it. A few inhalers are now being made with dose counters. Ask your health care provider for a prescription that has a dose counter if you feel  you need that extra help. If your inhaler does not have a counter, ask your health care provider to help you determine the date you need to refill your inhaler. Write the refill date on a calendar or your inhaler canister. Refill your inhaler 7-10 days before it runs out. Be sure to keep an adequate supply of medicine. This includes making sure it is not expired, and that you have a spare inhaler.  SEEK MEDICAL CARE IF:   Your symptoms are only partially relieved with your inhaler.  You are having trouble using your inhaler.  You have some increase in phlegm. SEEK IMMEDIATE MEDICAL CARE IF:   You feel little or no relief with your inhalers. You are still wheezing and are feeling shortness of breath or tightness in your chest or both.  You have dizziness, headaches, or a fast  heart rate.  You have chills, fever, or night sweats.  You have a noticeable increase in phlegm production, or there is blood in the phlegm. MAKE SURE YOU:   Understand these instructions.  Will watch your condition.  Will get help right away if you are not doing well or get worse. Document Released: 02/10/2000 Document Revised: 12/03/2012 Document Reviewed: 09/11/2012 [facility] Patient Information 2015 [place], [place]. This information is not intended to replace advice given to you by your health care provider. Make sure you discuss any questions you have with your health care provider.

## 2014-04-18 NOTE — ED Provider Notes (Signed)
CSN: [id number]     Arrival date & time 04/18/14  1145 History   First MD Initiated Contact with Patient 04/18/14 1156     Chief Complaint  Patient presents with  . Cough  . Shortness of Breath     (Consider location/radiation/quality/duration/timing/severity/associated sxs/prior Treatment) HPI [name] is a 39 y.o. female with a history of morbid obesity comes in for evaluation of cough and shortness of breath. Patient states last week she began to cough and has progressively become more short of breath after she has her "coughing fits". She has tried Mucinex and a friend's albuterol inhaler without much relief. She denies fevers at home. She reports the cough is usually productive after she wakes up in the morning but not so much throughout the day. No hemoptysis. She also reports increasing shortness of breath whenever she lies flat. She denies chest pain, unusual leg swelling, recent travel or surgeries, nausea or vomiting, diarrhea or constipation, abdominal pain, numbness or weakness, syncope, new rashes. Current half pack per day smoker Past Medical History  Diagnosis Date  . Obesity   . Bronchitis   . Hypertension   . Arthritis   . Carpal tunnel syndrome    Past Surgical History  Procedure Laterality Date  . Tonsillectomy    . Tonsillectomy    . Tubal ligation    . Wrist surgery      bilateral, carpal tunnel   Family History  Problem Relation Age of Onset  . Hypertension Mother   . Cancer Mother   . Coronary artery disease Mother   . Osteoarthritis Mother   . Diabetes Father   . Hypertension Father   . Asthma Father   . Asthma Sister   . Diabetes Sister    History  Substance Use Topics  . Smoking status: Current Every Day Smoker -- 1.00 packs/day for 10 years    Types: Cigarettes  . Smokeless tobacco: Not on file  . Alcohol Use: Yes     Comment: occ   OB History    No data available     Review of Systems A 10 point review of systems was  completed and was negative except for pertinent positives and negatives as mentioned in the history of present illness     Allergies  Review of patient's allergies indicates no known allergies.  Home Medications   Prior to Admission medications   Medication Sig Start Date End Date Taking? Authorizing Provider  albuterol (PROVENTIL HFA;VENTOLIN HFA) 108 (90 BASE) MCG/ACT inhaler Inhale 1-2 puffs into the lungs every 6 (six) hours as needed for wheezing or shortness of breath. 02/28/13   Heather Laisure, PA-C  albuterol (PROVENTIL HFA;VENTOLIN HFA) 108 (90 BASE) MCG/ACT inhaler Inhale 1-2 puffs into the lungs every 4 (four) hours as needed for wheezing or shortness of breath. 07/11/13   Nicole Pisciotta, PA-C  albuterol (PROVENTIL HFA;VENTOLIN HFA) 108 (90 BASE) MCG/ACT inhaler Inhale 2 puffs into the lungs every 2 (two) hours as needed for wheezing or shortness of breath (cough). 04/18/14   Earle Gell Mathew Postiglione, PA-C  azithromycin (ZITHROMAX Z-PAK) 250 MG tablet 2 po day one, then 1 daily x 4 days 07/11/13   Joni Reining Pisciotta, PA-C  cephALEXin (KEFLEX) 500 MG capsule Take 1 capsule (500 mg total) by mouth 3 (three) times daily. 03/11/14   Ozella Rocks, MD  diclofenac (VOLTAREN) 75 MG EC tablet Take 1 tablet (75 mg total) by mouth 2 (two) times daily. 03/11/14   Ozella Rocks, MD  fluconazole (DIFLUCAN) 150 MG tablet Take 1 tablet (150 mg total) by mouth daily. Repeat dose in 3 days 03/11/14   Ozella Rocksavid J Merrell, MD  fluticasone [facility](FLONASE) 50 MCG/ACT nasal spray Place 2 sprays into both nostrils daily. 07/11/13   Nicole Pisciotta, PA-C  guaiFENesin (ROBITUSSIN) 100 MG/5ML liquid Take 600 mg by mouth 3 (three) times daily as needed for cough or congestion.    Historical Provider, MD  guaifenesin (ROBITUSSIN) 100 MG/5ML syrup Take 5-10 mLs (100-200 mg total) by mouth every 4 (four) hours as needed for cough. 04/18/14   Earle GellBenjamin W Emani Taussig, PA-C  ibuprofen (ADVIL,MOTRIN) 600 MG tablet Take 1 tablet (600 mg  total) by mouth every 6 (six) hours as needed. 04/18/14   Earle GellBenjamin W Korey Prashad, PA-C  loratadine (CLARITIN) 10 MG tablet Take 10 mg by mouth daily.    Historical Provider, MD  Olopatadine HCl 0.2 % SOLN Apply 1 drop to eye daily. 09/30/13   Ozella Rocksavid J Merrell, MD  predniSONE (DELTASONE) 20 MG tablet Take 2 tablets (40 mg total) by mouth daily. 07/11/13   Nicole Pisciotta, PA-C  trimethoprim-polymyxin b (POLYTRIM) ophthalmic solution Place 1-2 drops into the right eye every 4 (four) hours. 09/30/13   Ozella Rocksavid J Merrell, MD   BP 115/76 mmHg  Pulse 79  Temp(Src) 98.2 F (36.8 C) (Oral)  Resp 22  SpO2 100%  LMP 04/14/2014 Physical Exam  Constitutional: She is oriented to person, place, and time. She appears well-developed and well-nourished.  Morbidly obese  HENT:  Head: Normocephalic and atraumatic.  Mouth/Throat: Oropharynx is clear and moist.  Eyes: Conjunctivae are normal. Pupils are equal, round, and reactive to light. Right eye exhibits no discharge. Left eye exhibits no discharge. No scleral icterus.  Neck: Neck supple.  Cardiovascular: Normal rate, regular rhythm and normal heart sounds.   Pulmonary/Chest: Effort normal and breath sounds normal. No respiratory distress. She has no wheezes. She has no rales.  Mild expiratory wheezing heard diffusely in upper lung fields. Difficult to appreciate lung sounds in bases due to body habitus. Patient is maintaining oxygen saturations greater than 95% on room air.  Abdominal: Soft. There is no tenderness.  Musculoskeletal: She exhibits no tenderness.  Neurological: She is alert and oriented to person, place, and time.  Cranial Nerves II-XII grossly intact  Skin: Skin is warm and dry. No rash noted.  Psychiatric: She has a normal mood and affect.  Nursing note and vitals reviewed.   ED Course  Procedures (including critical care time) Labs Review Labs Reviewed - No data to display  Imaging Review Dg Chest 2 View  04/18/2014   CLINICAL DATA:   Productive cough for 1 week, shortness of Breath  EXAM: CHEST  2 VIEW  COMPARISON:  07/11/2013  FINDINGS: Cardiomediastinal silhouette is stable. Study is suboptimal due to patient's large body habitus. No acute infiltrate or pleural effusion. No pulmonary edema.  IMPRESSION: No active cardiopulmonary disease.   Electronically Signed   By: Natasha MeadLiviu  Pop M.D.   On: 04/18/2014 13:17     EKG Interpretation   Date/Time:  Sunday April 18 2014 11:53:07 EST Ventricular Rate:  96 PR Interval:  148 QRS Duration: 80 QT Interval:  340 QTC Calculation: 429 R Axis:   -80 Text Interpretation:  Normal sinus rhythm Left axis deviation Pulmonary  disease pattern Abnormal ECG No significant change was found Confirmed by  CAMPOS  MD, Caryn BeeKEVIN ([id number]) on 04/18/2014 12:32:06 PM     Meds given in ED:  Medications  ipratropium-albuterol (DUONEB)  0.5-2.5 (3) MG/3ML nebulizer solution 3 mL (3 mLs Nebulization Given 04/18/14 1256)    New Prescriptions   ALBUTEROL (PROVENTIL HFA;VENTOLIN HFA) 108 (90 BASE) MCG/ACT INHALER    Inhale 2 puffs into the lungs every 2 (two) hours as needed for wheezing or shortness of breath (cough).   GUAIFENESIN (ROBITUSSIN) 100 MG/5ML SYRUP    Take 5-10 mLs (100-200 mg total) by mouth every 4 (four) hours as needed for cough.   IBUPROFEN (ADVIL,MOTRIN) 600 MG TABLET    Take 1 tablet (600 mg total) by mouth every 6 (six) hours as needed.   Filed Vitals:   04/18/14 1153 04/18/14 1400  BP: 147/83 115/76  Pulse: 90 79  Temp: 98.2 F (36.8 C)   TempSrc: Oral   Resp: 22   SpO2: 97% 100%  '  MDM  Vitals stable - WNL -afebrile, Oxygen saturations 100% on room air. Pt resting comfortably in ED. Reports breathing treatment helped her breathing.  PE--limited by body habitus, but not concerning for other acute or emergent pathology. Imaging-chest x-ray shows no acute current pulmonary pathology.  DDX--patient likely suffering from acute bronchitis. Low concern for pulmonary embolism.  Will treat with antitussives, bronchodilator, NSAIDs and instructions to follow-up with her PCP. Extensive time spent discussing smoking cessation.  I discussed all relevant lab findings and imaging results with pt and they verbalized understanding. Discussed f/u with PCP within 48 hrs and return precautions, pt very amenable to plan.  Prior to patient discharge, I discussed and reviewed this case with Dr. Patria Mane     Final diagnoses:  Bronchitis        Earle Gell [place], PA-C 04/18/14 1408  Lyanne Co, MD 04/18/14 [phone]

## 2014-07-09 ENCOUNTER — Emergency Department (HOSPITAL_COMMUNITY)
Admission: EM | Admit: 2014-07-09 | Discharge: 2014-07-09 | Disposition: A | Discharge status: Home / Self Care | Payer: Medicaid Other | Attending: Emergency Medicine | Admitting: Emergency Medicine

## 2014-07-09 ENCOUNTER — Emergency Department (HOSPITAL_COMMUNITY): Payer: Medicaid Other

## 2014-07-09 ENCOUNTER — Encounter (HOSPITAL_COMMUNITY): Payer: Self-pay | Admitting: *Deleted

## 2014-07-09 DIAGNOSIS — Z792 Long term (current) use of antibiotics: Secondary | ICD-10-CM | POA: Diagnosis not present

## 2014-07-09 DIAGNOSIS — Z791 Long term (current) use of non-steroidal anti-inflammatories (NSAID): Secondary | ICD-10-CM | POA: Insufficient documentation

## 2014-07-09 DIAGNOSIS — I10 Essential (primary) hypertension: Secondary | ICD-10-CM | POA: Insufficient documentation

## 2014-07-09 DIAGNOSIS — R51 Headache: Secondary | ICD-10-CM | POA: Diagnosis present

## 2014-07-09 DIAGNOSIS — M1990 Unspecified osteoarthritis, unspecified site: Secondary | ICD-10-CM | POA: Diagnosis not present

## 2014-07-09 DIAGNOSIS — G43909 Migraine, unspecified, not intractable, without status migrainosus: Secondary | ICD-10-CM | POA: Diagnosis not present

## 2014-07-09 DIAGNOSIS — Z7952 Long term (current) use of systemic steroids: Secondary | ICD-10-CM | POA: Diagnosis not present

## 2014-07-09 DIAGNOSIS — Z7951 Long term (current) use of inhaled steroids: Secondary | ICD-10-CM | POA: Diagnosis not present

## 2014-07-09 DIAGNOSIS — G43009 Migraine without aura, not intractable, without status migrainosus: Secondary | ICD-10-CM

## 2014-07-09 DIAGNOSIS — Z720 Tobacco use: Secondary | ICD-10-CM | POA: Diagnosis not present

## 2014-07-09 DIAGNOSIS — R202 Paresthesia of skin: Secondary | ICD-10-CM | POA: Insufficient documentation

## 2014-07-09 DIAGNOSIS — Z8709 Personal history of other diseases of the respiratory system: Secondary | ICD-10-CM | POA: Insufficient documentation

## 2014-07-09 DIAGNOSIS — Z79899 Other long term (current) drug therapy: Secondary | ICD-10-CM | POA: Insufficient documentation

## 2014-07-09 HISTORY — DX: Migraine, unspecified, not intractable, without status migrainosus: G43.909

## 2014-07-09 LAB — I-STAT CHEM 8, ED
BUN: 3 mg/dL — AB (ref 6–20)
CALCIUM ION: 1.07 mmol/L — AB (ref 1.12–1.23)
Chloride: 103 mmol/L (ref 101–111)
Creatinine, Ser: 0.6 mg/dL (ref 0.44–1.00)
Glucose, Bld: 141 mg/dL — ABNORMAL HIGH (ref 65–99)
HCT: 46 % (ref 36.0–46.0)
Hemoglobin: 15.6 g/dL — ABNORMAL HIGH (ref 12.0–15.0)
POTASSIUM: 3.7 mmol/L (ref 3.5–5.1)
Sodium: 140 mmol/L (ref 135–145)
TCO2: 21 mmol/L (ref 0–100)

## 2014-07-09 LAB — DIFFERENTIAL
BASOS ABS: 0 10*3/uL (ref 0.0–0.1)
Basophils Relative: 1 % (ref 0–1)
EOS ABS: 0.2 10*3/uL (ref 0.0–0.7)
EOS PCT: 3 % (ref 0–5)
Lymphocytes Relative: 31 % (ref 12–46)
Lymphs Abs: 2.5 10*3/uL (ref 0.7–4.0)
MONO ABS: 0.5 10*3/uL (ref 0.1–1.0)
Monocytes Relative: 6 % (ref 3–12)
NEUTROS PCT: 59 % (ref 43–77)
Neutro Abs: 4.7 10*3/uL (ref 1.7–7.7)

## 2014-07-09 LAB — URINE MICROSCOPIC-ADD ON

## 2014-07-09 LAB — RAPID URINE DRUG SCREEN, HOSP PERFORMED
Amphetamines: NOT DETECTED
Barbiturates: NOT DETECTED
Benzodiazepines: NOT DETECTED
Cocaine: NOT DETECTED
Opiates: NOT DETECTED
Tetrahydrocannabinol: NOT DETECTED

## 2014-07-09 LAB — PROTIME-INR
INR: 1.04 (ref 0.00–1.49)
Prothrombin Time: 13.7 seconds (ref 11.6–15.2)

## 2014-07-09 LAB — URINALYSIS, ROUTINE W REFLEX MICROSCOPIC
Bilirubin Urine: NEGATIVE
Glucose, UA: NEGATIVE mg/dL
KETONES UR: NEGATIVE mg/dL
Nitrite: NEGATIVE
PROTEIN: NEGATIVE mg/dL
Specific Gravity, Urine: 1.008 (ref 1.005–1.030)
UROBILINOGEN UA: 0.2 mg/dL (ref 0.0–1.0)
pH: 5.5 (ref 5.0–8.0)

## 2014-07-09 LAB — COMPREHENSIVE METABOLIC PANEL
ALBUMIN: 3.4 g/dL — AB (ref 3.5–5.0)
ALT: 19 U/L (ref 14–54)
ANION GAP: 9 (ref 5–15)
AST: 17 U/L (ref 15–41)
Alkaline Phosphatase: 74 U/L (ref 38–126)
CO2: 24 mmol/L (ref 22–32)
Calcium: 8.6 mg/dL — ABNORMAL LOW (ref 8.9–10.3)
Chloride: 106 mmol/L (ref 101–111)
Creatinine, Ser: 0.63 mg/dL (ref 0.44–1.00)
GFR calc non Af Amer: >60 mL/min (ref >60)
Glucose, Bld: 139 mg/dL — ABNORMAL HIGH (ref 65–99)
POTASSIUM: 3.9 mmol/L (ref 3.5–5.1)
SODIUM: 139 mmol/L (ref 135–145)
Total Bilirubin: 0.5 mg/dL (ref 0.3–1.2)
Total Protein: 7.1 g/dL (ref 6.5–8.1)

## 2014-07-09 LAB — ETHANOL: Alcohol, Ethyl (B): <5 mg/dL (ref <5)

## 2014-07-09 LAB — I-STAT TROPONIN, ED: Troponin i, poc: 0 ng/mL (ref 0.00–0.08)

## 2014-07-09 LAB — CBC
HCT: 41.8 % (ref 36.0–46.0)
Hemoglobin: 14.1 g/dL (ref 12.0–15.0)
MCH: 33.4 pg (ref 26.0–34.0)
MCHC: 33.7 g/dL (ref 30.0–36.0)
MCV: 99.1 fL (ref 78.0–100.0)
PLATELETS: 354 10*3/uL (ref 150–400)
RBC: 4.22 MIL/uL (ref 3.87–5.11)
RDW: 13.5 % (ref 11.5–15.5)
WBC: 8 10*3/uL (ref 4.0–10.5)

## 2014-07-09 LAB — APTT: APTT: 33 s (ref 24–37)

## 2014-07-09 MED ORDER — DIPHENHYDRAMINE HCL 50 MG/ML IJ SOLN
25.0000 mg | Freq: Once | INTRAMUSCULAR | Status: AC
  Administered 2014-07-09: 25 mg via INTRAVENOUS
  Filled 2014-07-09: qty 1

## 2014-07-09 MED ORDER — METHYLPREDNISOLONE SODIUM SUCC 125 MG IJ SOLR
125.0000 mg | Freq: Once | INTRAMUSCULAR | Status: AC
  Administered 2014-07-09: 125 mg via INTRAVENOUS
  Filled 2014-07-09: qty 2

## 2014-07-09 MED ORDER — SUMATRIPTAN SUCCINATE 6 MG/0.5ML ~~LOC~~ SOLN
6.0000 mg | Freq: Once | SUBCUTANEOUS | Status: AC
  Administered 2014-07-09: 6 mg via SUBCUTANEOUS
  Filled 2014-07-09: qty 0.5

## 2014-07-09 MED ORDER — PROCHLORPERAZINE EDISYLATE 5 MG/ML IJ SOLN
10.0000 mg | Freq: Once | INTRAMUSCULAR | Status: AC
  Administered 2014-07-09: 10 mg via INTRAVENOUS
  Filled 2014-07-09: qty 2

## 2014-07-09 MED ORDER — SODIUM CHLORIDE 0.9 % IV BOLUS (SEPSIS)
500.0000 mL | Freq: Once | INTRAVENOUS | Status: AC
  Administered 2014-07-09: 500 mL via INTRAVENOUS

## 2014-07-09 MED ORDER — SUMATRIPTAN SUCCINATE 100 MG PO TABS: 100.0000 mg | ORAL_TABLET | ORAL | Status: DC | PRN

## 2014-07-09 NOTE — ED Provider Notes (Signed)
CSN: [id number]     Arrival date & time 07/09/14  1343 History   First MD Initiated Contact with Patient 07/09/14 1508     Chief Complaint  Patient presents with  . Headache  . Numbness     (Consider location/radiation/quality/duration/timing/severity/associated sxs/prior Treatment) Patient is a 39 y.o. female presenting with headaches. The history is provided by the patient.  Headache Associated symptoms: no abdominal pain, no back pain, no cough, no diarrhea, no eye pain, no fever, no neck pain, no numbness, no sinus pressure, no vomiting and no weakness   Patient indicates hx migraines, and c/o left frontal headache for the past week. States head pain c/w prior migraines. Gradual onset, dull, moderate. No radiation of pain. No nv. States had transient tingling of right chin earlier today, but no consistent numbness or weakness. Pt denies any change in vision or speech. No problems w balance, coordination or normal functional ability. No head injury or fall. No syncope. Denies eye pain or change in vision. No sinus congestion or drainage. No uri c/o. No fever or chills. States in past had taken imitrex w good results. No acute or abrupt worsening of pain today. Pt drove self to ED.      Past Medical History  Diagnosis Date  . Obesity   . Bronchitis   . Hypertension   . Arthritis   . Carpal tunnel syndrome   . Migraine    Past Surgical History  Procedure Laterality Date  . Tonsillectomy    . Tonsillectomy    . Tubal ligation    . Wrist surgery      bilateral, carpal tunnel   Family History  Problem Relation Age of Onset  . Hypertension Mother   . Cancer Mother   . Coronary artery disease Mother   . Osteoarthritis Mother   . Diabetes Father   . Hypertension Father   . Asthma Father   . Asthma Sister   . Diabetes Sister    History  Substance Use Topics  . Smoking status: Current Every Day Smoker -- 1.00 packs/day for 10 years    Types: Cigarettes  . Smokeless  tobacco: Not on file  . Alcohol Use: Yes     Comment: occ   OB History    No data available     Review of Systems  Constitutional: Negative for fever and chills.  HENT: Negative for sinus pressure.   Eyes: Negative for pain, redness and visual disturbance.  Respiratory: Negative for cough and shortness of breath.   Cardiovascular: Negative for chest pain.  Gastrointestinal: Negative for vomiting, abdominal pain and diarrhea.  Genitourinary: Negative for dysuria and flank pain.  Musculoskeletal: Negative for back pain and neck pain.  Skin: Negative for rash.  Neurological: Positive for headaches. Negative for syncope, speech difficulty, weakness and numbness.  Hematological: Does not bruise/bleed easily.  Psychiatric/Behavioral: Negative for confusion.      Allergies  Review of patient's allergies indicates no known allergies.  Home Medications   Prior to Admission medications   Medication Sig Start Date End Date Taking? Authorizing Provider  albuterol (PROVENTIL HFA;VENTOLIN HFA) 108 (90 BASE) MCG/ACT inhaler Inhale 2 puffs into the lungs every 2 (two) hours as needed for wheezing or shortness of breath (cough). 04/18/14   Joycie Peek, PA-C  azithromycin (ZITHROMAX Z-PAK) 250 MG tablet 2 po day one, then 1 daily x 4 days 07/11/13   Joni Reining Pisciotta, PA-C  cephALEXin (KEFLEX) 500 MG capsule Take 1 capsule (500 mg total) by  mouth 3 (three) times daily. 03/11/14   Ozella Rocks, MD  diclofenac (VOLTAREN) 75 MG EC tablet Take 1 tablet (75 mg total) by mouth 2 (two) times daily. 03/11/14   Ozella Rocks, MD  fluconazole (DIFLUCAN) 150 MG tablet Take 1 tablet (150 mg total) by mouth daily. Repeat dose in 3 days 03/11/14   Ozella Rocks, MD  fluticasone [facility]) 50 MCG/ACT nasal spray Place 2 sprays into both nostrils daily. 07/11/13   Nicole Pisciotta, PA-C  guaifenesin (ROBITUSSIN) 100 MG/5ML syrup Take 5-10 mLs (100-200 mg total) by mouth every 4 (four) hours as needed for  cough. 04/18/14   Joycie Peek, PA-C  ibuprofen (ADVIL,MOTRIN) 600 MG tablet Take 1 tablet (600 mg total) by mouth every 6 (six) hours as needed. 04/18/14   Joycie Peek, PA-C  loratadine (CLARITIN) 10 MG tablet Take 10 mg by mouth daily.    Historical Provider, MD  Olopatadine HCl 0.2 % SOLN Apply 1 drop to eye daily. 09/30/13   Ozella Rocks, MD  predniSONE (DELTASONE) 20 MG tablet Take 2 tablets (40 mg total) by mouth daily. 07/11/13   Nicole Pisciotta, PA-C  trimethoprim-polymyxin b (POLYTRIM) ophthalmic solution Place 1-2 drops into the right eye every 4 (four) hours. 09/30/13   Ozella Rocks, MD   BP 169/81 mmHg  Pulse 112  Temp(Src) 98.3 F (36.8 C) (Oral)  Resp 18  Ht  (1.6 m)  Wt 460 lb 9 oz (208.91 kg)  BMI 81.61 kg/m2  SpO2 94%  LMP 06/15/2014 Physical Exam  Constitutional: She is oriented to person, place, and time. She appears well-developed and well-nourished. No distress.  HENT:  Head: Atraumatic.  Nose: Nose normal.  Mouth/Throat: Oropharynx is clear and moist.  No sinus or temporal tenderness.  Eyes: Conjunctivae and EOM are normal. Pupils are equal, round, and reactive to light. No scleral icterus.  Neck: Neck supple. No tracheal deviation present. No thyromegaly present.  No stiffness or rigidity.   Cardiovascular: Normal rate, regular rhythm, normal heart sounds and intact distal pulses.  Exam reveals no gallop and no friction rub.   No murmur heard. Pulmonary/Chest: Effort normal and breath sounds normal. No respiratory distress.  Abdominal: Soft. Normal appearance and bowel sounds are normal. She exhibits no distension. There is no tenderness.  Morbidly obese  Genitourinary:  No cva tenderness.  Musculoskeletal: Normal range of motion. She exhibits no edema or tenderness.  Neurological: She is alert and oriented to person, place, and time. No cranial nerve deficit.  Speech clear/fluent. No aphasia. No pronator drift. Motor intact bilaterally. stre  5/5. sens grossly intact.  Steady gait.   Skin: Skin is warm and dry. No rash noted. She is not diaphoretic.  Psychiatric: She has a normal mood and affect.  Nursing note and vitals reviewed.   ED Course  Procedures (including critical care time) Labs Review  Results for orders placed or performed during the hospital encounter of 07/09/14  Ethanol  Result Value Ref Range   Alcohol, Ethyl (B) <5 <5 mg/dL  Protime-INR  Result Value Ref Range   Prothrombin Time 13.7 11.6 - 15.2 seconds   INR 1.04 0.00 - 1.49  APTT  Result Value Ref Range   aPTT 33 24 - 37 seconds  CBC  Result Value Ref Range   WBC 8.0 4.0 - 10.5 K/uL   RBC 4.22 3.87 - 5.11 MIL/uL   Hemoglobin 14.1 12.0 - 15.0 g/dL   HCT 16.1 09.6 - 04.5 %  MCV 99.1 78.0 - 100.0 fL   MCH 33.4 26.0 - 34.0 pg   MCHC 33.7 30.0 - 36.0 g/dL   RDW 95.613.5 21.311.5 - 08.615.5 %   Platelets 354 150 - 400 K/uL  Differential  Result Value Ref Range   Neutrophils Relative % 59 43 - 77 %   Neutro Abs 4.7 1.7 - 7.7 K/uL   Lymphocytes Relative 31 12 - 46 %   Lymphs Abs 2.5 0.7 - 4.0 K/uL   Monocytes Relative 6 3 - 12 %   Monocytes Absolute 0.5 0.1 - 1.0 K/uL   Eosinophils Relative 3 0 - 5 %   Eosinophils Absolute 0.2 0.0 - 0.7 K/uL   Basophils Relative 1 0 - 1 %   Basophils Absolute 0.0 0.0 - 0.1 K/uL  Comprehensive metabolic panel  Result Value Ref Range   Sodium 139 135 - 145 mmol/L   Potassium 3.9 3.5 - 5.1 mmol/L   Chloride 106 101 - 111 mmol/L   CO2 24 22 - 32 mmol/L   Glucose, Bld 139 (H) 65 - 99 mg/dL   BUN <5 (L) 6 - 20 mg/dL   Creatinine, Ser 5.780.63 0.44 - 1.00 mg/dL   Calcium 8.6 (L) 8.9 - 10.3 mg/dL   Total Protein 7.1 6.5 - 8.1 g/dL   Albumin 3.4 (L) 3.5 - 5.0 g/dL   AST 17 15 - 41 U/L   ALT 19 14 - 54 U/L   Alkaline Phosphatase 74 38 - 126 U/L   Total Bilirubin 0.5 0.3 - 1.2 mg/dL   GFR calc non Af Amer >60 >60 mL/min   GFR calc Af Amer >60 >60 mL/min   Anion gap 9 5 - 15  Urine Drug Screen  Result Value Ref Range    Opiates NONE DETECTED NONE DETECTED   Cocaine NONE DETECTED NONE DETECTED   Benzodiazepines NONE DETECTED NONE DETECTED   Amphetamines NONE DETECTED NONE DETECTED   Tetrahydrocannabinol NONE DETECTED NONE DETECTED   Barbiturates NONE DETECTED NONE DETECTED  Urinalysis, Routine w reflex microscopic  Result Value Ref Range   Color, Urine YELLOW YELLOW   APPearance CLOUDY (A) CLEAR   Specific Gravity, Urine 1.008 1.005 - 1.030   pH 5.5 5.0 - 8.0   Glucose, UA NEGATIVE NEGATIVE mg/dL   Hgb urine dipstick TRACE (A) NEGATIVE   Bilirubin Urine NEGATIVE NEGATIVE   Ketones, ur NEGATIVE NEGATIVE mg/dL   Protein, ur NEGATIVE NEGATIVE mg/dL   Urobilinogen, UA 0.2 0.0 - 1.0 mg/dL   Nitrite NEGATIVE NEGATIVE   Leukocytes, UA SMALL (A) NEGATIVE  Urine microscopic-add on  Result Value Ref Range   Squamous Epithelial / LPF MANY (A) RARE   WBC, UA 3-6 <3 WBC/hpf   RBC / HPF 0-2 <3 RBC/hpf   Bacteria, UA MANY (A) RARE   Urine-Other MUCOUS PRESENT   I-Stat Chem 8, ED  ([facility])  Result Value Ref Range   Sodium 140 135 - 145 mmol/L   Potassium 3.7 3.5 - 5.1 mmol/L   Chloride 103 101 - 111 mmol/L   BUN 3 (L) 6 - 20 mg/dL   Creatinine, Ser 4.690.60 0.44 - 1.00 mg/dL   Glucose, Bld 629141 (H) 65 - 99 mg/dL   Calcium, Ion 5.281.07 (L) 1.12 - 1.23 mmol/L   TCO2 21 0 - 100 mmol/L   Hemoglobin 15.6 (H) 12.0 - 15.0 g/dL   HCT 41.346.0 24.436.0 - 01.046.0 %  I-stat troponin, ED ([facility])  Result Value Ref  Range   Troponin i, poc 0.00 0.00 - 0.08 ng/mL   Comment 3           Ct Head Wo Contrast  07/09/2014   CLINICAL DATA:  Headache for 1 week.  Right-sided facial numbness.  EXAM: CT HEAD WITHOUT CONTRAST  TECHNIQUE: Contiguous axial images were obtained from the base of the skull through the vertex without intravenous contrast.  COMPARISON:  None.  FINDINGS: Bony calvarium appears intact. No mass effect or midline shift is noted. Ventricular size is within normal limits. There is no evidence of mass lesion,  hemorrhage or acute infarction. Posterior fossa is not well visualized due to artifact.  IMPRESSION: Visualization of posterior fossa is limited due to artifact. No definite intracranial abnormality is noted.   Electronically Signed   By: Lupita RaiderJames  Green Jr, M.D.   On: 07/09/2014 15:35       EKG Interpretation   Date/Time:  Friday Jul 09 2014 14:43:32 EDT Ventricular Rate:  91 PR Interval:  147 QRS Duration: 92 QT Interval:  365 QTC Calculation: 449 R Axis:   -62 Text Interpretation:  Sinus rhythm Left anterior fascicular block Low  voltage, precordial leads No significant change since last tracing  Confirmed by POLLINA  MD, CHRISTOPHER ([id number]) on 07/09/2014 2:48:20 PM      MDM   Iv ns. Labs sent from triage.  Reviewed nursing notes and prior charts for additional history.   Compazine in triage.  Recheck headache improved but persists. Pt notes imitrex has helped/worked in past. imitrex sq.  Po fluids.  Recheck feels much improved. No distress. Afeb.   Pt currently appears stable for d/c.     Cathren LaineKevin Kirstina Leinweber, MD 07/09/14 [phone]

## 2014-07-09 NOTE — ED Notes (Signed)
Pt reports chronic migraines. Last night began having intermittent numbness to right side of face since last night. No facial droop noted at triage.

## 2014-07-09 NOTE — Discharge Instructions (Signed)
It was our pleasure to provide your ER care today - we hope that you feel better.  Rest. Drink adequate fluids.  You may take imitrex as need for migraine.  Take a maximum of 2 tablets per 24 hour period.\  You may also try motrin or aleve as need for pain.  Follow up with primary care doctor in coming week if symptoms fail to improve/resolve.  Return to ER if worse, new symptoms, fevers, persistent vomiting, other concern.      Migraine Headache A migraine headache is an intense, throbbing pain on one or both sides of your head. A migraine can last for 30 minutes to several hours. CAUSES  The exact cause of a migraine headache is not always known. However, a migraine may be caused when nerves in the brain become irritated and release chemicals that cause inflammation. This causes pain. Certain things may also trigger migraines, such as:  Alcohol.  Smoking.  Stress.  Menstruation.  Aged cheeses.  Foods or drinks that contain nitrates, glutamate, aspartame, or tyramine.  Lack of sleep.  Chocolate.  Caffeine.  Hunger.  Physical exertion.  Fatigue.  Medicines used to treat chest pain (nitroglycerine), birth control pills, estrogen, and some blood pressure medicines. SIGNS AND SYMPTOMS  Pain on one or both sides of your head.  Pulsating or throbbing pain.  Severe pain that prevents daily activities.  Pain that is aggravated by any physical activity.  Nausea, vomiting, or both.  Dizziness.  Pain with exposure to bright lights, loud noises, or activity.  General sensitivity to bright lights, loud noises, or smells. Before you get a migraine, you may get warning signs that a migraine is coming (aura). An aura may include:  Seeing flashing lights.  Seeing bright spots, halos, or zigzag lines.  Having tunnel vision or blurred vision.  Having feelings of numbness or tingling.  Having trouble talking.  Having muscle weakness. DIAGNOSIS  A migraine  headache is often diagnosed based on:  Symptoms.  Physical exam.  A CT scan or MRI of your head. These imaging tests cannot diagnose migraines, but they can help rule out other causes of headaches. TREATMENT Medicines may be given for pain and nausea. Medicines can also be given to help prevent recurrent migraines.  HOME CARE INSTRUCTIONS  Only take over-the-counter or prescription medicines for pain or discomfort as directed by your health care provider. The use of long-term narcotics is not recommended.  Lie down in a dark, quiet room when you have a migraine.  Keep a journal to find out what may trigger your migraine headaches. For example, write down:  What you eat and drink.  How much sleep you get.  Any change to your diet or medicines.  Limit alcohol consumption.  Quit smoking if you smoke.  Get 7-9 hours of sleep, or as recommended by your health care provider.  Limit stress.  Keep lights dim if bright lights bother you and make your migraines worse. SEEK IMMEDIATE MEDICAL CARE IF:   Your migraine becomes severe.  You have a fever.  You have a stiff neck.  You have vision loss.  You have muscular weakness or loss of muscle control.  You start losing your balance or have trouble walking.  You feel faint or pass out.  You have severe symptoms that are different from your first symptoms. MAKE SURE YOU:   Understand these instructions.  Will watch your condition.  Will get help right away if you are not doing well  or get worse. Document Released: 02/12/2005 Document Revised: 06/29/2013 Document Reviewed: 10/20/2012 [facility] Patient Information 2015 [employer] [place]. This information is not intended to replace advice given to you by your health care provider. Make sure you discuss any questions you have with your health care provider.

## 2014-12-19 ENCOUNTER — Emergency Department (INDEPENDENT_AMBULATORY_CARE_PROVIDER_SITE_OTHER)
Admission: EM | Admit: 2014-12-19 | Discharge: 2014-12-19 | Disposition: A | Discharge status: Home / Self Care | Payer: Medicaid Other | Source: Home / Self Care | Attending: Family Medicine | Admitting: Family Medicine

## 2014-12-19 DIAGNOSIS — R062 Wheezing: Secondary | ICD-10-CM

## 2014-12-19 DIAGNOSIS — J40 Bronchitis, not specified as acute or chronic: Secondary | ICD-10-CM | POA: Diagnosis not present

## 2014-12-19 MED ORDER — ALBUTEROL SULFATE HFA 108 (90 BASE) MCG/ACT IN AERS: 2.0000 | INHALATION_SPRAY | RESPIRATORY_TRACT | Status: AC | PRN

## 2014-12-19 MED ORDER — AZITHROMYCIN 250 MG PO TABS: 250.0000 mg | ORAL_TABLET | Freq: Every day | ORAL | Status: DC

## 2014-12-19 NOTE — ED Notes (Signed)
[facility] a [facility] and chest pain that has been occurring for 1 week.

## 2014-12-19 NOTE — ED Provider Notes (Signed)
CSN: [id number]     Arrival date & time 12/19/14  1609 History   First MD Initiated Contact with Patient 12/19/14 1714     Chief Complaint  Patient presents with  . Cough  . Chest Pain   (Consider location/radiation/quality/duration/timing/severity/associated sxs/prior Treatment) Patient is a 39 y.o. female presenting with cough and chest pain. The history is provided by the patient. No language interpreter was used.  Cough Cough characteristics:  Productive Sputum characteristics:  Yellow and white Severity:  Moderate Onset quality:  Gradual Duration:  4 weeks (Gradually worsening) Timing:  Constant Progression:  Worsening Chronicity:  New Smoker: yes   Context: not animal exposure, not occupational exposure and not sick contacts   Relieved by:  Nothing Exacerbated by: worse at night. Associated symptoms: chest pain, shortness of breath and wheezing   Associated symptoms: no eye discharge, no fever, no headaches and no sinus congestion   Chest Pain Associated symptoms: cough and shortness of breath   Associated symptoms: no fever and no headache     Past Medical History  Diagnosis Date  . Obesity   . Bronchitis   . Hypertension   . Arthritis   . Carpal tunnel syndrome   . Migraine    Past Surgical History  Procedure Laterality Date  . Tonsillectomy    . Tonsillectomy    . Tubal ligation    . Wrist surgery      bilateral, carpal tunnel   Family History  Problem Relation Age of Onset  . Hypertension Mother   . Cancer Mother   . Coronary artery disease Mother   . Osteoarthritis Mother   . Diabetes Father   . Hypertension Father   . Asthma Father   . Asthma Sister   . Diabetes Sister    Social History  Substance Use Topics  . Smoking status: Current Every Day Smoker -- 1.00 packs/day for 10 years    Types: Cigarettes  . Smokeless tobacco: Not on file  . Alcohol Use: Yes     Comment: occ   OB History    No data available     Review of Systems   Constitutional: Negative for fever.  Eyes: Negative for discharge.  Respiratory: Positive for cough, shortness of breath and wheezing.   Cardiovascular: Positive for chest pain.       Chest pain with excessive coughing  Neurological: Negative for headaches.  All other systems reviewed and are negative.   Allergies  Review of patient's allergies indicates no known allergies.  Home Medications   Prior to Admission medications   Medication Sig Start Date End Date Taking? Authorizing Provider  albuterol (PROVENTIL HFA;VENTOLIN HFA) 108 (90 BASE) MCG/ACT inhaler Inhale 2 puffs into the lungs every 2 (two) hours as needed for wheezing or shortness of breath (cough). 04/18/14   Joycie Peek, PA-C  azithromycin (ZITHROMAX Z-PAK) 250 MG tablet 2 po day one, then 1 daily x 4 days 07/11/13   Joni Reining Pisciotta, PA-C  cephALEXin (KEFLEX) 500 MG capsule Take 1 capsule (500 mg total) by mouth 3 (three) times daily. 03/11/14   Ozella Rocks, MD  diclofenac (VOLTAREN) 75 MG EC tablet Take 1 tablet (75 mg total) by mouth 2 (two) times daily. Patient taking differently: Take 75 mg by mouth 2 (two) times daily as needed for mild pain.  03/11/14   Ozella Rocks, MD  fluconazole (DIFLUCAN) 150 MG tablet Take 1 tablet (150 mg total) by mouth daily. Repeat dose in 3 days 03/11/14  Ozella Rocksavid J Merrell, MD  fluticasone [facility](FLONASE) 50 MCG/ACT nasal spray Place 2 sprays into both nostrils daily. 07/11/13   Nicole Pisciotta, PA-C  guaifenesin (ROBITUSSIN) 100 MG/5ML syrup Take 5-10 mLs (100-200 mg total) by mouth every 4 (four) hours as needed for cough. 04/18/14   Joycie PeekBenjamin Cartner, PA-C  ibuprofen (ADVIL,MOTRIN) 600 MG tablet Take 1 tablet (600 mg total) by mouth every 6 (six) hours as needed. 04/18/14   Joycie PeekBenjamin Cartner, PA-C  loratadine (CLARITIN) 10 MG tablet Take 10 mg by mouth daily.    Historical Provider, MD  Olopatadine HCl 0.2 % SOLN Apply 1 drop to eye daily. 09/30/13   Ozella Rocksavid J Merrell, MD  predniSONE (DELTASONE)  20 MG tablet Take 2 tablets (40 mg total) by mouth daily. 07/11/13   Nicole Pisciotta, PA-C  SUMAtriptan (IMITREX) 100 MG tablet Take 1 tablet (100 mg total) by mouth every 2 (two) hours as needed for migraine. May repeat in 2 hours if headache persists or recurs.  Take maximum of 2 tablets per 24 hour period. 07/09/14   Cathren LaineKevin Steinl, MD  trimethoprim-polymyxin b (POLYTRIM) ophthalmic solution Place 1-2 drops into the right eye every 4 (four) hours. 09/30/13   Ozella Rocksavid J Merrell, MD   Meds Ordered and Administered this Visit  Medications - No data to display  LMP 12/12/2014 (Exact Date) No data found.   Physical Exam  Constitutional: She appears well-developed. No distress.  HENT:  Head: Atraumatic.  Right Ear: Tympanic membrane normal.  Left Ear: Tympanic membrane normal.  Mouth/Throat: Oropharynx is clear and moist and mucous membranes are normal.  Slight erythema of ear canal B/L, no discharge, no TM bulging.  Cardiovascular: Normal rate, regular rhythm, normal heart sounds and intact distal pulses.   No murmur heard. Pulmonary/Chest: Effort normal and breath sounds normal. No respiratory distress. She has no wheezes. She has no rales. She exhibits no tenderness.  Abdominal: Soft. Bowel sounds are normal. She exhibits no distension and no mass. There is no tenderness.  Nursing note and vitals reviewed.   ED Course  Procedures (including critical care time)  Labs Review Labs Reviewed - No data to display  Imaging Review No results found.   Visual Acuity Review  Right Eye Distance:   Left Eye Distance:   Bilateral Distance:    Right Eye Near:   Left Eye Near:    Bilateral Near:         MDM  No diagnosis found. Bronchitis Wheezing  Symptom worsening over the last 4 wks. Will give A/B coverage. Continue OTC cough regimen. Albuterol prescribed prn wheezing which she stated she gets mostly at night. Return precaution discussed.    Doreene ElandKehinde T Joao Mccurdy, MD 12/19/14 1728

## 2014-12-19 NOTE — Discharge Instructions (Signed)

## 2014-12-27 ENCOUNTER — Encounter (HOSPITAL_COMMUNITY): Payer: Self-pay | Admitting: Emergency Medicine

## 2014-12-27 ENCOUNTER — Emergency Department (HOSPITAL_COMMUNITY): Payer: Medicaid Other

## 2014-12-27 ENCOUNTER — Emergency Department (INDEPENDENT_AMBULATORY_CARE_PROVIDER_SITE_OTHER)
Admission: EM | Admit: 2014-12-27 | Discharge: 2014-12-27 | Disposition: A | Discharge status: Home / Self Care | Payer: Medicaid Other | Source: Home / Self Care | Attending: Emergency Medicine | Admitting: Emergency Medicine

## 2014-12-27 ENCOUNTER — Emergency Department (HOSPITAL_COMMUNITY)
Admission: EM | Admit: 2014-12-27 | Discharge: 2014-12-27 | Disposition: A | Discharge status: Home / Self Care | Payer: Medicaid Other | Attending: Emergency Medicine | Admitting: Emergency Medicine

## 2014-12-27 DIAGNOSIS — Z79899 Other long term (current) drug therapy: Secondary | ICD-10-CM | POA: Diagnosis not present

## 2014-12-27 DIAGNOSIS — R079 Chest pain, unspecified: Secondary | ICD-10-CM | POA: Insufficient documentation

## 2014-12-27 DIAGNOSIS — M1990 Unspecified osteoarthritis, unspecified site: Secondary | ICD-10-CM | POA: Insufficient documentation

## 2014-12-27 DIAGNOSIS — Z7951 Long term (current) use of inhaled steroids: Secondary | ICD-10-CM | POA: Insufficient documentation

## 2014-12-27 DIAGNOSIS — Z7952 Long term (current) use of systemic steroids: Secondary | ICD-10-CM | POA: Diagnosis not present

## 2014-12-27 DIAGNOSIS — F17210 Nicotine dependence, cigarettes, uncomplicated: Secondary | ICD-10-CM | POA: Insufficient documentation

## 2014-12-27 DIAGNOSIS — R0602 Shortness of breath: Secondary | ICD-10-CM | POA: Diagnosis present

## 2014-12-27 DIAGNOSIS — R0600 Dyspnea, unspecified: Secondary | ICD-10-CM | POA: Diagnosis not present

## 2014-12-27 DIAGNOSIS — Z792 Long term (current) use of antibiotics: Secondary | ICD-10-CM | POA: Insufficient documentation

## 2014-12-27 DIAGNOSIS — G43909 Migraine, unspecified, not intractable, without status migrainosus: Secondary | ICD-10-CM | POA: Insufficient documentation

## 2014-12-27 DIAGNOSIS — I10 Essential (primary) hypertension: Secondary | ICD-10-CM | POA: Insufficient documentation

## 2014-12-27 DIAGNOSIS — R05 Cough: Secondary | ICD-10-CM | POA: Diagnosis not present

## 2014-12-27 DIAGNOSIS — Z8709 Personal history of other diseases of the respiratory system: Secondary | ICD-10-CM | POA: Diagnosis not present

## 2014-12-27 DIAGNOSIS — M546 Pain in thoracic spine: Secondary | ICD-10-CM | POA: Diagnosis not present

## 2014-12-27 LAB — BASIC METABOLIC PANEL
ANION GAP: 11 (ref 5–15)
BUN: <5 mg/dL — ABNORMAL LOW (ref 6–20)
CO2: 25 mmol/L (ref 22–32)
Calcium: 8.9 mg/dL (ref 8.9–10.3)
Chloride: 102 mmol/L (ref 101–111)
Creatinine, Ser: 0.48 mg/dL (ref 0.44–1.00)
GFR calc Af Amer: >60 mL/min (ref >60)
GLUCOSE: 111 mg/dL — AB (ref 65–99)
POTASSIUM: 3.9 mmol/L (ref 3.5–5.1)
Sodium: 138 mmol/L (ref 135–145)

## 2014-12-27 LAB — CBC
HEMATOCRIT: 42.4 % (ref 36.0–46.0)
HEMOGLOBIN: 13.5 g/dL (ref 12.0–15.0)
MCH: 32.8 pg (ref 26.0–34.0)
MCHC: 31.8 g/dL (ref 30.0–36.0)
MCV: 102.9 fL — ABNORMAL HIGH (ref 78.0–100.0)
Platelets: 382 10*3/uL (ref 150–400)
RBC: 4.12 MIL/uL (ref 3.87–5.11)
RDW: 14.2 % (ref 11.5–15.5)
WBC: 9.7 10*3/uL (ref 4.0–10.5)

## 2014-12-27 LAB — I-STAT TROPONIN, ED: TROPONIN I, POC: 0 ng/mL (ref 0.00–0.08)

## 2014-12-27 LAB — BRAIN NATRIURETIC PEPTIDE: B Natriuretic Peptide: 12.5 pg/mL (ref 0.0–100.0)

## 2014-12-27 MED ORDER — IBUPROFEN 800 MG PO TABS: 800.0000 mg | ORAL_TABLET | Freq: Three times a day (TID) | ORAL | Status: DC

## 2014-12-27 NOTE — Discharge Instructions (Signed)
Take your medications as prescribed. Please follow up with a primary care provider from the Resource Guide provided below in 1 week. Please return to the Emergency Department if symptoms worsen or new onset of fever, chest pain, difficulty breathing, wheezing, coughing up blood, vomiting, numbness, tingling, weakness.    Emergency Department Resource Guide 1) Find a Doctor and Pay Out of Pocket Although you won't have to find out who is covered by your insurance plan, it is a good idea to ask around and get recommendations. You will then need to call the office and see if the doctor you have chosen will accept you as a new patient and what types of options they offer for patients who are self-pay. Some doctors offer discounts or will set up payment plans for their patients who do not have insurance, but you will need to ask so you aren't surprised when you get to your appointment.  2) Contact Your Local Health Department Not all health departments have doctors that can see patients for sick visits, but many do, so it is worth a call to see if yours does. If you don't know where your local health department is, you can check in your phone book. The CDC also has a tool to help you locate your state's health department, and many state websites also have listings of all of their local health departments.  3) Find a Walk-in Clinic If your illness is not likely to be very severe or complicated, you may want to try a walk in clinic. These are popping up all over the country in pharmacies, drugstores, and shopping centers. They're usually staffed by nurse practitioners or physician assistants that have been trained to treat common illnesses and complaints. They're usually fairly quick and inexpensive. However, if you have serious medical issues or chronic medical problems, these are probably not your best option.  No Primary Care Doctor: - Call Health Connect at  [phone] - they can help you locate a primary  care doctor that  accepts your insurance, provides certain services, etc. - Physician Referral Service- [phone]  Chronic Pain Problems: Organization         Address  Phone   Notes  [facility]  [phone] Patients need to be referred by their primary care doctor.   Medication Assistance: Organization         Address  Phone   Notes  [facility] [address] [place], [place] [address] [phone] --Must be a resident of [place] [facility] [place] -- Must have NO insurance coverage whatsoever (no Medicaid/ Medicare, etc.) -- The pt. MUST have a primary care doctor that directs their care regularly and follows them in the community   Med[facility]ist  [phone]   [employer]  [phone]    Agencies that provide inexpensive medical care: Organization         Address  Phone   Notes  [facility]  [phone]   [facility]    [phone]   [facility] [address] [place], [place] [address] [phone]   [facility] [address], [place] [phone]   [facility]anned [facility]    [phone]   [facility]    [phone]   [facility]  [address], [place] Phone:  [phone], Fax:  [phone] Hours of Operation:  9 am -  6 pm, M-F.  Also accepts Medicaid/Medicare and self-pay.  [facility] for [place], [address], [place] Phone: [phone], Fax: [phone]. Hours of Operation:  8:30 am - 5:30 pm, M-F.  Also accepts Medicaid and self-pay.  [facility] [place] [address] [place] Phone: [phone]   [place], [place], [place] [phone], Ext. [phone] Mondays & Thursdays: 7-9 AM.  First 15 patients are seen on a first come, first serve basis.    [place]  Providers:  Organization         Address  Phone   Notes  [facility] [address],  [phone] Also accepts self-pay patients.  [facility] [id number] [place], [place]  [phone]   [place], [address], [place] [phone]   [facility] [address], [place] [phone]   Lucianne Lei [address], [place]   [phone] Only accepts [place] Access [place] patients after they have their name applied to their card.   Self-Pay (no insurance) in [facility]:  Organization         Address  Phone   Notes  Sickle Cell Patients, [facility] [place] [phone]   [facility] [phone]   [facility] [place]  [place], [place], [place] [phone]   [facility]/Dr. Osei-Bonsu  [address], [place] or [address], [place] [phone] Phone number for both [place] and [place] locations is the same.  [facility] [address] [place] [phone]   [facility] [address] [place] or [address] [phone]   [facility] [address] [place] [phone], phone; [phone], fax Sees patients 1st and 3rd Saturday of every month.  Must not qualify for public or private insurance (i.e. Medicaid, Medicare, [place] Health Choice, Veterans' Benefits)  Household income should be no more than 200% of the poverty level The clinic cannot treat you if you are pregnant or think you are pregnant  Sexually transmitted diseases are not treated at the clinic.    Dental Care: Organization         Address  Phone  Notes  [facility] Department of [facility] [place] [phone] Accepts children up to age 12 who are enrolled in [place] or [employer]; pregnant women with a Medicaid card; and children who have applied for Medicaid or [place] Health Choice, but were declined, whose parents can pay a reduced fee at time of service.  [facility] Department of [facility]  [address], [place] [phone] Accepts children up to age 3 who are enrolled in [place] or [place]; pregnant women with a Medicaid card; and children who have applied for Medicaid or [place] Health Choice, but were declined, whose parents can pay a reduced fee at time of service.  [facility]  [place] [phone] Patients are seen by appointment only. Walk-ins are not accepted. [place] will see patients 73 years of age and  older. Monday - Tuesday (8am-5pm) Most Wednesdays (8:30-5pm) $30 per visit, cash only  [facility] Adult [employer] PROGRAM  [address], [facility] [phone] Patients are seen by appointment only. Walk-ins are not accepted. [place] will see patients 36 years of age and older. One Wednesday Evening (Monthly: Volunteer Based).  $30 per visit, cash only  [place]  [phone] for adults; Children under age 34, call Graduate Pediatric Dentistry at [phone]. Children aged 22-14, please call [phone] to request a pediatric application.  Dental services are provided in all areas of dental care including fillings, crowns and bridges, complete and partial dentures, implants, gum treatment, root canals, and extractions. Preventive care is also provided. Treatment is provided to both adults and children. Patients are selected via a lottery and there is often a waiting list.   [facility] [address] [place]  [phone] [email or web address]   [facility]  [address] [place], [place] [phone], Ext. [phone] Second and Fourth Thursday of each month, opens at 6:30 AM; Clinic ends at 9 AM.  Patients are seen on a first-come first-served basis, and a limited number are seen during each clinic.   [facility]  [address] Hillard Danker [place], [place] [phone]   Eligibility Requirements You must have lived in [place], [place], or [place] counties for at least the last three months.   You cannot be eligible for state or federal sponsored [employer], including [employer], [place], or [employer].   You generally cannot be eligible for healthcare insurance through your employer.    How to apply: Eligibility screenings are held every Tuesday and Wednesday afternoon from 1:00 pm until 4:00 pm. You do not need an appointment for the interview!  [facility] [address] [place], [place]   [place]  [place] Department  [place]  [phone]    Behavioral Health Resources in the Community: Intensive Outpatient Programs Organization         Address  Phone  Notes  [place]. [address], [place], [place] [phone]   [facility] [address] [place], [place]   ADS: Alcohol [facility] Drug S[facility]cs [address], [place], [place]   [place] [address],  [place], [place] or [phone]   Substance Abuse Resources Organization         Address  Phone  Notes  Alcohol and Drug Services  [phone]   [place]  [phone]   [facility] [place]   Chinita Pester  [phone]   Residential & Outpatient Substance Abuse Program  [phone]   Psychological Services Organization         Address  Phone  Notes  [facility]  [place]  [phone]   [place] [address] [place] [phone] or [phone]    Mobile Crisis Teams Organization         Address  Phone  Notes  [facility], Mobile Crisis Care Unit  [phone]   [facility]  [address] [place], [place]   [facility] [address], [place] [phone]    Self-Help/Support Groups Organization         Address  Phone             Notes  [facility] [place] - variety of support groups  [phone]- [phone] Call for more information  Narcotics Anonymous (NA), [facility] [address], [employer] [place]  2 meetings at this location   Statistician         Address  Phone  Notes  [facility] [address],    [place]  [phone]   [facility]  [address], [place] [address], [place], [place] [phone]   [facility] [address] [place], [place] [phone] Admissions: 8am-3pm M-F  [facility] [address],    [place], [place] [phone]   [facility] [address] [place], [place], [place] [phone]   [facility] [address]  [place], [place] [phone]   [facility] - Intensive Outpatient [address], [place], [place] [phone]   [facility] ([facility]iction [facility] [address] [place]  [place], [place] [phone] or [phone]   [facility] (RTS) [address] [place], [place] [phone] Accepts Medicaid  [facility] [place] [address],  [place] [phone] Substance Abuse/Addiction Treatment   [facility] Organization         Address  Phone  Notes  [facility]  [phone]   Angie Fava, PhD [address] Ervin Knack [place], [place]   [phone] or [phone]    [facility]   [address] [place], [place] [phone]   [facility] [address], [place], [place] [phone] Insurance/Medicaid/sponsorship through [facility] and Families [address]                                    [place], [place] [phone] Therapy/tele-psych/case  [facility] [address], [place] [phone]    Dr. Lolly Mustache  [phone]   [facility]  United Way [facility] Dept. 1) [address], [place] 2) [address] [place] 3)  [address], [place] [phone]  [phone]   [facility] Child Abuse Hotline 418-812-9431 or [phone] (After Hours)

## 2014-12-27 NOTE — ED Notes (Signed)
Pt here with c/o sob unrelieved by Inhaler. States she has been using inhaler Q2hrs with wheezing and now upper back pain Throb, stabbing pain noted Currently everyday 1/2 pack smoker Chest heaviness, no sleep study done the past  Sats 95% ra, Morbid obese

## 2014-12-27 NOTE — ED Notes (Signed)
Pt sent here from [facility] for continued sob x 2 weeks. Seen and tx with antibiotic last week-no relief of sx.. Told to come here for chf evaluation. Pt has never been dx with heart failure.

## 2014-12-27 NOTE — ED Provider Notes (Signed)
HPI  SUBJECTIVE:  Jillian Weber is a 39 y.o. female who presents with shortness of breath for the past 3-4 weeks. She states it is worse at night when lying down and with walking. She reports orthopnea, states that she has to sleep sitting up. This is new . She reports cough, wheezing, lower extremity edema, nocturia, PND, dyspnea on exertion to 5-10 feet. She reports some upper back pain starting recently. Reports chest pain described as pressure which has been constant for the past several weeks. It is not associated with walking, gets worse with lying down. It does not radiate to her neck or to her arm. Symptoms are worse with walking, no alleviating factors. She has been been using her inhaler every 1-2 hours without improvement. She was seen here one week ago for the same, thought to have a bronchitis, prescribed an inhaler and some antibiotics which the patient states that she took. Patient is a smoker, morbid obese, hypertension. No history of asthma, emphysema, COPD, DVT, PE, cancer, exogenous estrogen, CHF, MI,  diabetes.    Past Medical History  Diagnosis Date  . Obesity   . Bronchitis   . Hypertension   . Arthritis   . Carpal tunnel syndrome   . Migraine     Past Surgical History  Procedure Laterality Date  . Tonsillectomy    . Tonsillectomy    . Tubal ligation    . Wrist surgery      bilateral, carpal tunnel    Family History  Problem Relation Age of Onset  . Hypertension Mother   . Cancer Mother   . Coronary artery disease Mother   . Osteoarthritis Mother   . Diabetes Father   . Hypertension Father   . Asthma Father   . Asthma Sister   . Diabetes Sister     Social History  Substance Use Topics  . Smoking status: Current Every Day Smoker -- 1.00 packs/day for 10 years    Types: Cigarettes  . Smokeless tobacco: None  . Alcohol Use: Yes     Comment: occ    No current facility-administered medications for this encounter.  Current outpatient  prescriptions:  .  albuterol (PROVENTIL HFA;VENTOLIN HFA) 108 (90 BASE) MCG/ACT inhaler, Inhale 2 puffs into the lungs every 2 (two) hours as needed for wheezing or shortness of breath (cough)., Disp: 1 Inhaler, Rfl: 0 .  azithromycin (ZITHROMAX) 250 MG tablet, Take 1 tablet (250 mg total) by mouth daily. Take first 2 tablets together, then 1 every day until finished., Disp: 6 tablet, Rfl: 0 .  cephALEXin (KEFLEX) 500 MG capsule, Take 1 capsule (500 mg total) by mouth 3 (three) times daily., Disp: 21 capsule, Rfl: 0 .  diclofenac (VOLTAREN) 75 MG EC tablet, Take 1 tablet (75 mg total) by mouth 2 (two) times daily. (Patient taking differently: Take 75 mg by mouth 2 (two) times daily as needed for mild pain. ), Disp: 60 tablet, Rfl: 0 .  fluconazole (DIFLUCAN) 150 MG tablet, Take 1 tablet (150 mg total) by mouth daily. Repeat dose in 3 days, Disp: 2 tablet, Rfl: 0 .  fluticasone (FLONASE) 50 MCG/ACT nasal spray, Place 2 sprays into both nostrils daily., Disp: 16 g, Rfl: 0 .  guaifenesin (ROBITUSSIN) 100 MG/5ML syrup, Take 5-10 mLs (100-200 mg total) by mouth every 4 (four) hours as needed for cough., Disp: 60 mL, Rfl: 0 .  ibuprofen (ADVIL,MOTRIN) 600 MG tablet, Take 1 tablet (600 mg total) by mouth every 6 (six) hours as  needed., Disp: 30 tablet, Rfl: 0 .  loratadine (CLARITIN) 10 MG tablet, Take 10 mg by mouth daily., Disp: , Rfl:  .  Olopatadine HCl 0.2 % SOLN, Apply 1 drop to eye daily., Disp: 2.5 mL, Rfl: 0 .  predniSONE (DELTASONE) 20 MG tablet, Take 2 tablets (40 mg total) by mouth daily., Disp: 10 tablet, Rfl: 0 .  SUMAtriptan (IMITREX) 100 MG tablet, Take 1 tablet (100 mg total) by mouth every 2 (two) hours as needed for migraine. May repeat in 2 hours if headache persists or recurs.  Take maximum of 2 tablets per 24 hour period., Disp: 10 tablet, Rfl: 0 .  trimethoprim-polymyxin b (POLYTRIM) ophthalmic solution, Place 1-2 drops into the right eye every 4 (four) hours., Disp: 10 mL, Rfl: 0  No  Known Allergies   ROS  As noted in HPI.   Physical Exam  BP 140/76 mmHg  Pulse 84  Temp(Src) 97.9 F (36.6 C) (Oral)  Resp 22  SpO2 95%  LMP 12/12/2014 (Exact Date)  Constitutional: Well developed, well nourished, no acute distress Eyes:  EOMI, conjunctiva normal bilaterally HENT: Normocephalic, atraumatic,mucus membranes moist Respiratory: Normal inspiratory effort Fair movement, lungs clear bilaterally  Cardiovascular: Normal rate and rhythm, no murmurs, rubs, gallops  GI: nondistended skin: No rash, skin intact Musculoskeletal:Bilateral nonpitting lower extremity edema. Calves symmetric, nontender  Neurologic: Alert & oriented x 3, no focal neuro deficits Psychiatric: Speech and behavior appropriate   ED Course   Medications - No data to display  No orders of the defined types were placed in this encounter.    No results found for this or any previous visit (from the past 24 hour(s)). No results found.  ED Clinical Impression  Dyspnea  ED Assessment/Plan  Concern for congestive heart failure. Patient has tried outpatient therapy with albuterol and antibiotics and is getting worse. She states that the chest pain has been present and constant for several weeks and is at its baseline here.  Vitals are acceptable. Transferring to the ED for further workup.  *This clinic note was created using Dragon dictation software. Therefore, there may be occasional mistakes despite careful proofreading.  ?   Domenick Gong, MD 12/27/14 [phone]

## 2014-12-27 NOTE — ED Provider Notes (Signed)
CSN: [id number]     Arrival date & time 12/27/14  1753 History   First MD Initiated Contact with Patient 12/27/14 2017     Chief Complaint  Patient presents with  . Shortness of Breath     (Consider location/radiation/quality/duration/timing/severity/associated sxs/prior Treatment) HPI   Patient is a 39 year old female with past medical history morbid obesity ED and hypertension who presents to the ED with complaint of SOB, onset 3 weeks. Patient reports she has had worsening shortness of breath with associated chest pain and cough for the past 3 weeks. Patient describes her chest pain as constant pressure to her mid sternal chest without radiation that is worse with laying supine and alleviated while sitting up. Denies fever, chills, headache, congestion, sore throat, productive cough, hemoptysis, palpitations, abdominal pain, N/V/D, urinary sxs, numbness, tingling, weakness, leg swelling. Patient was seen in urgent care PTA and was transferred to the ED for concern of CHF.  Past Medical History  Diagnosis Date  . Obesity   . Bronchitis   . Hypertension   . Arthritis   . Carpal tunnel syndrome   . Migraine    Past Surgical History  Procedure Laterality Date  . Tonsillectomy    . Tonsillectomy    . Tubal ligation    . Wrist surgery      bilateral, carpal tunnel   Family History  Problem Relation Age of Onset  . Hypertension Mother   . Cancer Mother   . Coronary artery disease Mother   . Osteoarthritis Mother   . Diabetes Father   . Hypertension Father   . Asthma Father   . Asthma Sister   . Diabetes Sister    Social History  Substance Use Topics  . Smoking status: Current Every Day Smoker -- 1.00 packs/day for 10 years    Types: Cigarettes  . Smokeless tobacco: None  . Alcohol Use: Yes     Comment: occ   OB History    No data available     Review of Systems  Respiratory: Positive for cough and shortness of breath.   Cardiovascular: Positive for chest pain.   Musculoskeletal: Positive for back pain.  All other systems reviewed and are negative.     Allergies  Review of patient's allergies indicates no known allergies.  Home Medications   Prior to Admission medications   Medication Sig Start Date End Date Taking? Authorizing Provider  acetaminophen (TYLENOL) 650 MG CR tablet Take 1,300 mg by mouth every 8 (eight) hours as needed for pain.   Yes Historical Provider, MD  albuterol (PROVENTIL HFA;VENTOLIN HFA) 108 (90 BASE) MCG/ACT inhaler Inhale 2 puffs into the lungs every 2 (two) hours as needed for wheezing or shortness of breath (cough). 12/19/14  Yes Doreene Eland, MD  cephALEXin (KEFLEX) 500 MG capsule Take 1 capsule (500 mg total) by mouth 3 (three) times daily. 03/11/14  Yes Ozella Rocks, MD  diclofenac (VOLTAREN) 75 MG EC tablet Take 1 tablet (75 mg total) by mouth 2 (two) times daily. Patient taking differently: Take 75 mg by mouth 2 (two) times daily as needed for mild pain.  03/11/14  Yes Ozella Rocks, MD  diphenhydramine-acetaminophen (TYLENOL PM) 25-500 MG TABS tablet Take 1 tablet by mouth at bedtime as needed.   Yes Historical Provider, MD  fluconazole (DIFLUCAN) 150 MG tablet Take 1 tablet (150 mg total) by mouth daily. Repeat dose in 3 days 03/11/14  Yes Ozella Rocks, MD  fluticasone [facility]) 50 MCG/ACT nasal spray  Place 2 sprays into both nostrils daily. 07/11/13  Yes Breianna Delfino Pisciotta, PA-C  guaifenesin (ROBITUSSIN) 100 MG/5ML syrup Take 5-10 mLs (100-200 mg total) by mouth every 4 (four) hours as needed for cough. 04/18/14  Yes Joycie Peek, PA-C  ibuprofen (ADVIL,MOTRIN) 600 MG tablet Take 1 tablet (600 mg total) by mouth every 6 (six) hours as needed. 04/18/14  Yes Benjamin Cartner, PA-C  loratadine (CLARITIN) 10 MG tablet Take 10 mg by mouth daily.   Yes Historical Provider, MD  Olopatadine HCl 0.2 % SOLN Apply 1 drop to eye daily. 09/30/13  Yes Ozella Rocks, MD  predniSONE (DELTASONE) 20 MG tablet Take 2 tablets  (40 mg total) by mouth daily. 07/11/13  Yes Jocelyn Lowery Pisciotta, PA-C  SUMAtriptan (IMITREX) 100 MG tablet Take 1 tablet (100 mg total) by mouth every 2 (two) hours as needed for migraine. May repeat in 2 hours if headache persists or recurs.  Take maximum of 2 tablets per 24 hour period. 07/09/14  Yes Cathren Laine, MD  trimethoprim-polymyxin b (POLYTRIM) ophthalmic solution Place 1-2 drops into the right eye every 4 (four) hours. 09/30/13  Yes Ozella Rocks, MD  azithromycin (ZITHROMAX) 250 MG tablet Take 1 tablet (250 mg total) by mouth daily. Take first 2 tablets together, then 1 every day until finished. Patient not taking: Reported on 12/27/2014 12/19/14   Doreene Eland, MD   BP 126/93 mmHg  Pulse 83  Temp(Src) 98.1 F (36.7 C) (Oral)  Resp 20  Ht  (1.6 m)  Wt 525 lb 8 oz (238.365 kg)  BMI 93.11 kg/m2  SpO2 93%  LMP 12/12/2014 (Exact Date) Physical Exam  Constitutional: She is oriented to person, place, and time. She appears well-developed and well-nourished. No distress.  Morbidly obese female sitting on stool comfortably and does not appear to be in any distress, no respiratory distress.  HENT:  Head: Normocephalic and atraumatic.  Eyes: Conjunctivae and EOM are normal. Pupils are equal, round, and reactive to light. Right eye exhibits no discharge. Left eye exhibits no discharge. No scleral icterus.  Neck: Normal range of motion. Neck supple.  Cardiovascular: Normal rate, regular rhythm, normal heart sounds and intact distal pulses.   Pulmonary/Chest: Effort normal and breath sounds normal. No respiratory distress. She has no wheezes. She has no rales. She exhibits no tenderness.  Abdominal: Soft. She exhibits no distension. There is no tenderness.  Musculoskeletal: Normal range of motion. She exhibits tenderness. She exhibits no edema.       Cervical back: Normal. She exhibits normal range of motion, no tenderness, no bony tenderness, no swelling, no edema, no deformity, no  laceration and no spasm.       Thoracic back: She exhibits tenderness. She exhibits normal range of motion, no bony tenderness, no swelling, no edema, no deformity, no laceration and no spasm.       Lumbar back: Normal. She exhibits normal range of motion, no tenderness, no bony tenderness, no swelling, no edema, no deformity, no laceration and no spasm.  No C, T, L midline tenderness. TTP at bilateral thoracic paraspinal muscles. FROM of back. 5/5 strength of upper and lower extremities. Sensation intact. 2+ distal pulses.  Lymphadenopathy:    She has no cervical adenopathy.  Neurological: She is alert and oriented to person, place, and time. She has normal strength. No sensory deficit. Coordination and gait normal.  Patient able to stand and ambulate and room without assistance.  Skin: Skin is warm and dry. She is not  diaphoretic.  Nursing note and vitals reviewed.   ED Course  Procedures (including critical care time) Labs Review Labs Reviewed  BASIC METABOLIC PANEL - Abnormal; Notable for the following:    Glucose, Bld 111 (*)    BUN <5 (*)    All other components within normal limits  CBC - Abnormal; Notable for the following:    MCV 102.9 (*)    All other components within normal limits  BRAIN NATRIURETIC PEPTIDE  I-STAT TROPOININ, ED    Imaging Review Dg Chest 2 View  12/27/2014  CLINICAL DATA:  Two week history of shortness of breath. Patient was treated with antibiotics last week after being seen in urgent care with no relief. Current smoker. EXAM: CHEST  2 VIEW COMPARISON:  04/18/2014 and earlier. FINDINGS: Suboptimal inspiration due to body habitus accounts for crowded bronchovascular markings, especially in the bases, and accentuates the cardiac silhouette. Taking this into account, cardiac silhouette upper normal in size, unchanged. Hilar and mediastinal contours unremarkable. Lungs clear. Bronchovascular markings normal. Pulmonary vascularity normal. No visible pleural  effusions. No pneumothorax. 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 IMPRESSION: Suboptimal inspiration.  No acute cardiopulmonary disease. Electronically Signed   By: Hulan Saashomas  Lawrence M.D.   On: 12/27/2014 18:45   I have personally reviewed and evaluated these images and lab results as part of my medical decision-making.   EKG Interpretation   Date/Time:  Monday December 27 2014 18:07:38 EDT Ventricular Rate:  78 PR Interval:  152 QRS Duration: 78 QT Interval:  380 QTC Calculation: 433 R Axis:   151 Text Interpretation:  Normal sinus rhythm Right axis deviation Abnormal  ECG No significant change since last tracing Confirmed by YAO  MD, DAVID  ([id number]) on 12/27/2014 8:57:16 PM      MDM   Final diagnoses:  SOB (shortness of breath)  Morbid obesity, unspecified obesity type (HCC)  Bilateral thoracic back pain    Patient presents with shortness of breath for the past 2 weeks. Endorses cough, back pain, chest pain. Patient seen at urgent care PTA and was sent to the ED for concern of CHF. VSS. Exam revealed morbidly obese female, tenderness at thoracic paraspinal muscles, patient able to stand and ambulate and room, no neuro deficits. Cardiac exam unremarkable. Lungs CTAB. EKG revealed normal sinus rhythm. Troponin negative. BNP 12.5. Labs unremarkable. PERC negative. I have a low suspicion for ACS, PE, dissection, or other acute cardiac event at this time. I suspect patient's SOB may be due to her her body habitus vs. Viral URI and I suspect her back pain is likely muscular in etiology and does not warrant any further workup or imaging at this time. Plan to discharge patient home. Advised patient to take NSAIDs for pain relief. Patient given resource guide to follow up with primary care provider.  Evaluation does not show pathology requring ongoing emergent intervention or admission. Pt is hemodynamically stable and mentating appropriately. Discussed  findings/results and plan with patient/guardian, who agrees with plan. All questions answered. Return precautions discussed and outpatient follow up given.        Satira Sarkicole Elizabeth SyracuseNadeau, [place] JerseyPA-C 12/28/14 0104  Richardean Canalavid H Yao, MD 12/30/14 [phone]

## 2015-01-05 ENCOUNTER — Emergency Department (HOSPITAL_COMMUNITY)
Admission: EM | Admit: 2015-01-05 | Discharge: 2015-01-05 | Disposition: A | Discharge status: Home / Self Care | Payer: Medicaid Other | Attending: Emergency Medicine | Admitting: Emergency Medicine

## 2015-01-05 ENCOUNTER — Encounter (HOSPITAL_COMMUNITY): Payer: Self-pay

## 2015-01-05 ENCOUNTER — Emergency Department (HOSPITAL_COMMUNITY): Payer: Medicaid Other

## 2015-01-05 DIAGNOSIS — E663 Overweight: Secondary | ICD-10-CM | POA: Diagnosis not present

## 2015-01-05 DIAGNOSIS — M1990 Unspecified osteoarthritis, unspecified site: Secondary | ICD-10-CM | POA: Diagnosis not present

## 2015-01-05 DIAGNOSIS — Z720 Tobacco use: Secondary | ICD-10-CM | POA: Diagnosis not present

## 2015-01-05 DIAGNOSIS — R0602 Shortness of breath: Secondary | ICD-10-CM | POA: Diagnosis present

## 2015-01-05 DIAGNOSIS — I10 Essential (primary) hypertension: Secondary | ICD-10-CM | POA: Diagnosis not present

## 2015-01-05 DIAGNOSIS — G479 Sleep disorder, unspecified: Secondary | ICD-10-CM | POA: Diagnosis not present

## 2015-01-05 DIAGNOSIS — J40 Bronchitis, not specified as acute or chronic: Secondary | ICD-10-CM | POA: Diagnosis not present

## 2015-01-05 LAB — BASIC METABOLIC PANEL
Anion gap: 7 (ref 5–15)
CHLORIDE: 103 mmol/L (ref 101–111)
CO2: 28 mmol/L (ref 22–32)
Calcium: 9.2 mg/dL (ref 8.9–10.3)
Creatinine, Ser: 0.51 mg/dL (ref 0.44–1.00)
GFR calc Af Amer: >60 mL/min (ref >60)
GFR calc non Af Amer: >60 mL/min (ref >60)
GLUCOSE: 104 mg/dL — AB (ref 65–99)
POTASSIUM: 4.1 mmol/L (ref 3.5–5.1)
Sodium: 138 mmol/L (ref 135–145)

## 2015-01-05 LAB — CBC
HEMATOCRIT: 40.1 % (ref 36.0–46.0)
Hemoglobin: 12.7 g/dL (ref 12.0–15.0)
MCH: 32.6 pg (ref 26.0–34.0)
MCHC: 31.7 g/dL (ref 30.0–36.0)
MCV: 103.1 fL — AB (ref 78.0–100.0)
Platelets: 329 10*3/uL (ref 150–400)
RBC: 3.89 MIL/uL (ref 3.87–5.11)
RDW: 14.3 % (ref 11.5–15.5)
WBC: 9.4 10*3/uL (ref 4.0–10.5)

## 2015-01-05 LAB — I-STAT TROPONIN, ED: Troponin i, poc: 0.01 ng/mL (ref 0.00–0.08)

## 2015-01-05 LAB — BRAIN NATRIURETIC PEPTIDE: B NATRIURETIC PEPTIDE 5: 23.7 pg/mL (ref 0.0–100.0)

## 2015-01-05 MED ORDER — ALBUTEROL SULFATE (2.5 MG/3ML) 0.083% IN NEBU
5.0000 mg | INHALATION_SOLUTION | Freq: Once | RESPIRATORY_TRACT | Status: AC
  Administered 2015-01-05: 5 mg via RESPIRATORY_TRACT

## 2015-01-05 MED ORDER — ALBUTEROL SULFATE HFA 108 (90 BASE) MCG/ACT IN AERS
2.0000 | INHALATION_SPRAY | RESPIRATORY_TRACT | Status: DC | PRN
  Administered 2015-01-05: 2 via RESPIRATORY_TRACT
  Filled 2015-01-05: qty 6.7

## 2015-01-05 MED ORDER — ALBUTEROL SULFATE (2.5 MG/3ML) 0.083% IN NEBU
5.0000 mg | INHALATION_SOLUTION | Freq: Once | RESPIRATORY_TRACT | Status: AC
  Administered 2015-01-05: 5 mg via RESPIRATORY_TRACT
  Filled 2015-01-05: qty 6

## 2015-01-05 MED ORDER — BENZONATATE 100 MG PO CAPS: 100.0000 mg | ORAL_CAPSULE | Freq: Three times a day (TID) | ORAL | Status: DC

## 2015-01-05 MED ORDER — IPRATROPIUM BROMIDE 0.02 % IN SOLN
0.5000 mg | Freq: Once | RESPIRATORY_TRACT | Status: AC
  Administered 2015-01-05: 0.5 mg via RESPIRATORY_TRACT
  Filled 2015-01-05: qty 2.5

## 2015-01-05 MED ORDER — ALBUTEROL SULFATE (2.5 MG/3ML) 0.083% IN NEBU
INHALATION_SOLUTION | RESPIRATORY_TRACT | Status: AC
  Filled 2015-01-05: qty 6

## 2015-01-05 MED ORDER — AEROCHAMBER PLUS FLO-VU SMALL MISC
1.0000 | Freq: Once | Status: AC
  Administered 2015-01-05: 1
  Filled 2015-01-05: qty 1

## 2015-01-05 MED ORDER — BENZONATATE 100 MG PO CAPS
100.0000 mg | ORAL_CAPSULE | Freq: Once | ORAL | Status: AC
  Administered 2015-01-05: 100 mg via ORAL
  Filled 2015-01-05: qty 1

## 2015-01-05 NOTE — Discharge Instructions (Signed)
Please read and follow all provided instructions.  Your diagnoses today include:  1. Shortness of breath   2. Bronchitis     Tests performed today include:  Chest x-ray - does not show any pneumonia  Blood counts and electrolytes - normal  Test for heart failure - normal  Vital signs. See below for your results today.   Medications prescribed:   Albuterol inhaler - medication that opens up your airway  Use inhaler as follows: 1-2 puffs with spacer every 4 hours as needed for wheezing, cough, or shortness of breath.    Tessalon Perles - cough suppressant medication  Take any prescribed medications only as directed.  Home care instructions:  Follow any educational materials contained in this packet.  Follow-up instructions: Please follow-up with your primary care provider as directed. Please call the pulmonary doctor for further evaluation of your shortness of breath.  Return instructions:   Please return to the Emergency Department if you experience worsening symptoms.  Please return with worsening wheezing, shortness of breath, or difficulty breathing.  Return with persistent fever above 101F.   Please return if you have any other emergent concerns.  Additional Information:  Your vital signs today were: BP 123/49 mmHg   Pulse 89   Temp(Src) 98.2 F (36.8 C) (Oral)   Resp 22   Ht 5' 3.5" (1.613 m)   Wt 525 lb (238.138 kg)   BMI 91.53 kg/m2   SpO2 93%   LMP 12/12/2014 (Exact Date) If your blood pressure (BP) was elevated above 135/85 this visit, please have this repeated by your doctor within one month. --------------

## 2015-01-05 NOTE — ED Notes (Signed)
Pt verbalizes understanding of discharge instructions as well as follow up information. NAD. VSS. A/O x4.

## 2015-01-05 NOTE — ED Provider Notes (Signed)
CSN: [id number]     Arrival date & time 01/05/15  1026 History   First MD Initiated Contact with Patient 01/05/15 1243     Chief Complaint  Patient presents with  . Shortness of Breath    back pain     (Consider location/radiation/quality/duration/timing/severity/associated sxs/prior Treatment) HPI Comments: Patient with history of bronchitis, overweight -- presents with continued shortness of breath. This has been ongoing for the past month. Patient was seen at urgent care on 10/23 and treated for bronchitis. She was then seen again on 10/31 and transferred to the emergency department due to concerns of congestive heart failure. Her x-ray and labs were reassuring and she was discharged home with symptomatic care. Patient tried to follow-up with [facility] but could not get an appointment. She returns today with continued symptoms. She denies fever but has had wheezing and cough. She feels short of breath especially at night and is having difficulty sleeping. Patient has pain in her sides and back when she coughs. She states that she has to sleep sitting up. Patient's legs are swollen at baseline. She is up at work but does not exercise regularly. No nausea, vomiting, or diarrhea. She denies chest pain. Onset of symptoms insidious. Course is constant. Nothing makes symptoms better.  Patient is a 39 y.o. female presenting with shortness of breath. The history is provided by the patient and medical records.  Shortness of Breath Associated symptoms: cough and wheezing   Associated symptoms: no abdominal pain, no chest pain, no fever, no headaches, no rash, no sore throat and no vomiting     Past Medical History  Diagnosis Date  . Obesity   . Bronchitis   . Hypertension   . Arthritis   . Carpal tunnel syndrome   . Migraine    Past Surgical History  Procedure Laterality Date  . Tonsillectomy    . Tonsillectomy    . Tubal ligation    . Wrist surgery      bilateral,  carpal tunnel   Family History  Problem Relation Age of Onset  . Hypertension Mother   . Cancer Mother   . Coronary artery disease Mother   . Osteoarthritis Mother   . Diabetes Father   . Hypertension Father   . Asthma Father   . Asthma Sister   . Diabetes Sister    Social History  Substance Use Topics  . Smoking status: Current Every Day Smoker -- 1.00 packs/day for 10 years    Types: Cigarettes  . Smokeless tobacco: None  . Alcohol Use: Yes     Comment: occ   OB History    No data available     Review of Systems  Constitutional: Negative for fever and chills.  HENT: Negative for rhinorrhea and sore throat.   Eyes: Negative for redness.  Respiratory: Positive for cough, shortness of breath and wheezing.   Cardiovascular: Negative for chest pain.  Gastrointestinal: Negative for nausea, vomiting, abdominal pain and diarrhea.  Genitourinary: Negative for dysuria.  Musculoskeletal: Negative for myalgias.  Skin: Negative for rash.  Neurological: Negative for headaches.      Allergies  Review of patient's allergies indicates no known allergies.  Home Medications   Prior to Admission medications   Medication Sig Start Date End Date Taking? Authorizing Provider  acetaminophen (TYLENOL) 650 MG CR tablet Take 1,950-2,600 mg by mouth every 8 (eight) hours as needed for pain.    Yes Historical Provider, MD  albuterol (PROVENTIL HFA;VENTOLIN HFA)  108 (90 BASE) MCG/ACT inhaler Inhale 2 puffs into the lungs every 2 (two) hours as needed for wheezing or shortness of breath (cough). 12/19/14  Yes Doreene ElandKehinde T Eniola, MD  diclofenac (VOLTAREN) 75 MG EC tablet Take 1 tablet (75 mg total) by mouth 2 (two) times daily. Patient not taking: Reported on 01/05/2015 03/11/14   Ozella Rocksavid J Merrell, MD  fluticasone [facility](FLONASE) 50 MCG/ACT nasal spray Place 2 sprays into both nostrils daily. Patient not taking: Reported on 01/05/2015 07/11/13   Joni ReiningNicole Pisciotta, PA-C  ibuprofen (ADVIL,MOTRIN) 800 MG  tablet Take 1 tablet (800 mg total) by mouth 3 (three) times daily. Patient not taking: Reported on 01/05/2015 12/27/14   Barrett HenleNicole Elizabeth Nadeau, PA-C  Olopatadine HCl 0.2 % SOLN Apply 1 drop to eye daily. Patient not taking: Reported on 01/05/2015 09/30/13   Ozella Rocksavid J Merrell, MD  predniSONE (DELTASONE) 20 MG tablet Take 2 tablets (40 mg total) by mouth daily. Patient not taking: Reported on 01/05/2015 07/11/13   Joni ReiningNicole Pisciotta, PA-C  SUMAtriptan (IMITREX) 100 MG tablet Take 1 tablet (100 mg total) by mouth every 2 (two) hours as needed for migraine. May repeat in 2 hours if headache persists or recurs.  Take maximum of 2 tablets per 24 hour period. Patient not taking: Reported on 01/05/2015 07/09/14   Cathren LaineKevin Steinl, MD   BP 123/49 mmHg  Pulse 89  Temp(Src) 98.2 F (36.8 C) (Oral)  Resp 22  Ht 5' 3.5" (1.613 m)  Wt 525 lb (238.138 kg)  BMI 91.53 kg/m2  SpO2 93%  LMP 12/12/2014 (Exact Date) Physical Exam  Constitutional: She appears well-developed and well-nourished.  HENT:  Head: Normocephalic and atraumatic.  Mouth/Throat: Oropharynx is clear and moist.  Eyes: Conjunctivae are normal. Right eye exhibits no discharge. Left eye exhibits no discharge.  Neck: Normal range of motion. Neck supple.  Cardiovascular: Normal rate, regular rhythm and normal heart sounds.   No murmur heard. Pulmonary/Chest: Effort normal and breath sounds normal. No respiratory distress. She has no wheezes. She has no rales.  Abdominal: Soft. There is no tenderness.  Neurological: She is alert.  Skin: Skin is warm and dry.  Psychiatric: She has a normal mood and affect.  Nursing note and vitals reviewed.   ED Course  Procedures (including critical care time) Labs Review Labs Reviewed  BASIC METABOLIC PANEL - Abnormal; Notable for the following:    Glucose, Bld 104 (*)    BUN <5 (*)    All other components within normal limits  CBC - Abnormal; Notable for the following:    MCV 103.1 (*)    All other  components within normal limits  BRAIN NATRIURETIC PEPTIDE  I-STAT TROPOININ, ED    Imaging Review Dg Chest 2 View  01/05/2015  CLINICAL DATA:  Shortness of breath EXAM: CHEST  2 VIEW COMPARISON:  12/27/2014 chest radiograph. FINDINGS: Hypo inspiratory frontal chest radiograph. Stable cardiomediastinal silhouette with top-normal heart size. No pneumothorax. No pleural effusion. Clear lungs, with no focal lung consolidation and no pulmonary edema. IMPRESSION: No active cardiopulmonary disease. Electronically Signed   By: Delbert PhenixJason A Poff M.D.   On: 01/05/2015 12:13   I have personally reviewed and evaluated these images and lab results as part of my medical decision-making.   EKG Interpretation   Date/Time:  Wednesday January 05 2015 10:50:21 EST Ventricular Rate:  89 PR Interval:  154 QRS Duration: 76 QT Interval:  366 QTC Calculation: 445 R Axis:   -115 Text Interpretation:  Normal sinus rhythm Right superior  axis deviation  Pulmonary disease pattern Abnormal ECG No significant change since last  tracing Confirmed by [facility]  MD, MARTHA [phone]) on 01/05/2015 1:16:15 PM       2:07 PM Patient seen and examined. Work-up reviewed. Patient did receive some benefit from albuterol treatment given on arrival. Will give additional treatment. I spoke with the case manager who is scheduling PCP follow-up for the patient. Will also give pulmonary referral area and patient I discussed that she should try to exercise if possible and to follow-up as losing weight will likely help her breathing overall. She may also need evaluation for sleep apnea.  Vital signs reviewed and are as follows: BP 123/49 mmHg  Pulse 89  Temp(Src) 98.2 F (36.8 C) (Oral)  Resp 22  Ht 5' 3.5" (1.613 m)  Wt 525 lb (238.138 kg)  BMI 91.53 kg/m2  SpO2 93%  LMP 12/12/2014 (Exact Date)  2:47 PM patient stable. Case manager was able to schedule an appointment for the patient on 12/1. Patient encouraged keep this. Will  discharge her to home with an albuterol inhaler and spacer, Tessalon. Also given pulmonary referral for consideration of a sleep study.   Patient encouraged to return to the emergency department with fever, worsening shortness of breath, trouble breathing, increased work of breathing, new symptoms or other concerns. She verbalizes understanding and agrees with plan.  MDM   Final diagnoses:  Shortness of breath  Bronchitis   Patient with shortness of breath over the past month. She likely has had bronchitis with reported wheezing however she certainly has a degree of restrictive lung disease given her body habitus. She describes difficulty sleeping at night and daytime drowsiness concerning for obstructive sleep apnea. Patient needs close PCP follow-up to address weight and ensure resolution of bronchitis. Pulmonary referral given for further consideration for sleep study, other testing. We discussed that losing some weight will be very beneficial to her breathing. Return and follow-up instructions as above.  No concern for pulmonary embolism given normal vital signs the hypoxia, unchanged symptoms over the period of one month. No clinical findings suggestive of DVT however exam is difficult due to habitus.  Renne Crigler, PA-C 01/05/15 1450  Jerelyn Scott, MD 01/05/15 [phone]

## 2015-01-05 NOTE — ED Notes (Signed)
Pt. Having sob for over 1 month, is having difficulty sleeping.  She is having lower back pain also with bilateral side pain.  Pt. Able to speak in full sentences.  Pt. Denies any chest pain b ut is having back and side pain 20/10.  Skin is warm and dry., resp. E/u  Pt. Reports having cough and cold symptoms

## 2015-01-05 NOTE — Care Management Note (Signed)
Case Management Note  Patient Details  Name: Jillian Weber MRN: [id number] Date of Birth: [birth year]  Subjective/Objective:                  Patient with history of bronchitis, overweight -- presents with continued shortness of breath. This has been ongoing for the past month.// Home alone  Action/Plan: Follow for disposition needs..  Expected Discharge Date:       01/05/2015           Expected Discharge Plan:  Home/Self Care  In-House Referral:  NA  Discharge planning Services  CM Consult, Follow-up appt scheduled  Post Acute Care Choice:  NA Choice offered to:  Patient  DME Arranged:  N/A DME Agency:     HH Arranged:  NA HH Agency:  NA  Status of Service:  Completed, signed off  Medicare Important Message Given:    Date Medicare IM Given:    Medicare IM give by:    Date Additional Medicare IM Given:    Additional Medicare Important Message give by:     If discussed at [place] of Stay Meetings, dates discussed:    Additional Comments: [name] J. Clydene Laming, RN, BSN, [place] [phone] ED CM consulted regarding PCP establishment. Pt presented to [facility] ED today with bronchitis and back pain. NCM met with pt at bedside; pt confirms not having access to f/u care with PCP. Discussed with patient importance and benefits of establishing PCP, and not utilizing the ED for primary care needs. Pt verbalized understanding and is in agreement.   Pt voiced interest in the [facility] and [place].  NCM advised that [facility]  Internal Medicine providers are seeing pts at [facility]. Pt verbalized understanding. NCM set up appointment with Sharon Seller, NP 01/27/2015 at 2:15.  Fuller Mandril, RN 01/05/2015, 2:17 PM

## 2015-01-27 ENCOUNTER — Encounter: Payer: Self-pay | Admitting: Family Medicine

## 2015-01-27 ENCOUNTER — Ambulatory Visit (INDEPENDENT_AMBULATORY_CARE_PROVIDER_SITE_OTHER): Payer: Medicaid Other | Admitting: Family Medicine

## 2015-01-27 VITALS — BP 134/71 | HR 80 | Temp 98.2°F | Ht 63.0 in | Wt >= 6400 oz

## 2015-01-27 DIAGNOSIS — Z7189 Other specified counseling: Secondary | ICD-10-CM | POA: Diagnosis not present

## 2015-01-27 DIAGNOSIS — Z0000 Encounter for general adult medical examination without abnormal findings: Secondary | ICD-10-CM

## 2015-01-27 DIAGNOSIS — R0600 Dyspnea, unspecified: Secondary | ICD-10-CM | POA: Diagnosis not present

## 2015-01-27 DIAGNOSIS — Z7689 Persons encountering health services in other specified circumstances: Secondary | ICD-10-CM

## 2015-01-27 DIAGNOSIS — M1990 Unspecified osteoarthritis, unspecified site: Secondary | ICD-10-CM

## 2015-01-27 LAB — COMPLETE METABOLIC PANEL WITH GFR
ALT: 19 U/L (ref 6–29)
AST: 18 U/L (ref 10–30)
Albumin: 3.2 g/dL — ABNORMAL LOW (ref 3.6–5.1)
Alkaline Phosphatase: 85 U/L (ref 33–115)
BILIRUBIN TOTAL: 0.5 mg/dL (ref 0.2–1.2)
BUN: 5 mg/dL — ABNORMAL LOW (ref 7–25)
CO2: 30 mmol/L (ref 20–31)
Calcium: 8.6 mg/dL (ref 8.6–10.2)
Chloride: 103 mmol/L (ref 98–110)
Creat: 0.37 mg/dL — ABNORMAL LOW (ref 0.50–1.10)
GFR, Est African American: >89 mL/min (ref >=60)
GLUCOSE: 96 mg/dL (ref 65–99)
POTASSIUM: 4.3 mmol/L (ref 3.5–5.3)
SODIUM: 139 mmol/L (ref 135–146)
TOTAL PROTEIN: 6.6 g/dL (ref 6.1–8.1)

## 2015-01-27 LAB — CBC WITH DIFFERENTIAL/PLATELET
Basophils Absolute: 0 10*3/uL (ref 0.0–0.1)
Basophils Relative: 0 % (ref 0–1)
Eosinophils Absolute: 0.3 10*3/uL (ref 0.0–0.7)
Eosinophils Relative: 3 % (ref 0–5)
HEMATOCRIT: 40 % (ref 36.0–46.0)
HEMOGLOBIN: 12.6 g/dL (ref 12.0–15.0)
LYMPHS ABS: 2 10*3/uL (ref 0.7–4.0)
LYMPHS PCT: 20 % (ref 12–46)
MCH: 32 pg (ref 26.0–34.0)
MCHC: 31.5 g/dL (ref 30.0–36.0)
MCV: 101.5 fL — AB (ref 78.0–100.0)
MONOS PCT: 8 % (ref 3–12)
MPV: 9.9 fL (ref 8.6–12.4)
Monocytes Absolute: 0.8 10*3/uL (ref 0.1–1.0)
NEUTROS ABS: 6.8 10*3/uL (ref 1.7–7.7)
NEUTROS PCT: 69 % (ref 43–77)
Platelets: 385 10*3/uL (ref 150–400)
RBC: 3.94 MIL/uL (ref 3.87–5.11)
RDW: 13.9 % (ref 11.5–15.5)
WBC: 9.8 10*3/uL (ref 4.0–10.5)

## 2015-01-27 LAB — LIPID PANEL
CHOL/HDL RATIO: 4.6 ratio (ref <=5.0)
Cholesterol: 151 mg/dL (ref 125–200)
HDL: 33 mg/dL — ABNORMAL LOW (ref >=46)
LDL Cholesterol: 97 mg/dL (ref <130)
Triglycerides: 103 mg/dL (ref <150)
VLDL: 21 mg/dL (ref <30)

## 2015-01-27 MED ORDER — SUMATRIPTAN SUCCINATE 100 MG PO TABS: ORAL_TABLET | ORAL | Status: DC

## 2015-01-27 MED ORDER — MELOXICAM 15 MG PO TABS: ORAL_TABLET | ORAL | Status: DC

## 2015-01-27 NOTE — Patient Instructions (Signed)
May take Imitrex as prescribed for migraine headaches It is most like that your shortness of breath and difficulty breathing are related to your weight (you have gained 60+ pounds since may of this year.  There is little we can do about the breathing issue unless you are able to lose weight and stop smoking I am prescribing Meloxicam for joint pain. Take with food Come back in 3 months to see how things are going. Come sooner if needed.

## 2015-01-27 NOTE — Progress Notes (Signed)
Patient ID: Jillian Weber, female   DOB: 11/27/1975, 39 y.o.   MRN: [id number]   Braelynn Benning, is a 39 y.o. female  [id number]  [id number]  DOB - 1975-06-06  CC:  Chief Complaint  Patient presents with  . New Evaluation  . Shortness of Breath    Started in October 2016  . Knee Pain  . Headache       HPI: Jillian Weber is a 39 y.o. female here to establish care. She has not had a primary care provider in several years. She has been seen several times this year in the ED for shortness of breath, coughing, wheezing. All x-ray have shown no active cardiopulmonary disease. The main finding has been hypoaeration due to body habitus. She reports she is unable to do anything with becoming short of breath. She has smoked for 22 years and smokes a pack a day. In addition she weighs 525 pounds which is up from 460 in May of this year. She also reports migraine headaches. She is currently using OTC analgesics with minor relief. She has been treated with Imitrex in the past which did help. She also has arthritis of multiple joints from osteoarthritis. Her shortness of breath has been label as bronchitis despite radiographic evidence.  No Known Allergies Past Medical History  Diagnosis Date  . Obesity   . Bronchitis   . Hypertension   . Arthritis   . Carpal tunnel syndrome   . Migraine    Current Outpatient Prescriptions on File Prior to Visit  Medication Sig Dispense Refill  . albuterol (PROVENTIL HFA;VENTOLIN HFA) 108 (90 BASE) MCG/ACT inhaler Inhale 2 puffs into the lungs every 2 (two) hours as needed for wheezing or shortness of breath (cough). 1 Inhaler 0  . [DISCONTINUED] fluticasone (FLONASE) 50 MCG/ACT nasal spray Place 2 sprays into both nostrils daily. (Patient not taking: Reported on 01/05/2015) 16 g 0   No current facility-administered medications on file prior to visit.   Family History  Problem Relation Age of Onset  . Hypertension Mother   . Cancer  Mother   . Coronary artery disease Mother   . Osteoarthritis Mother   . Diabetes Father   . Hypertension Father   . Asthma Father   . Asthma Sister   . Diabetes Sister    Social History   Social History  . Marital Status: Married    Spouse Name: N/A  . Number of Children: N/A  . Years of Education: N/A   Occupational History  . Not on file.   Social History Main Topics  . Smoking status: Current Every Day Smoker -- 1.00 packs/day for 10 years    Types: Cigarettes  . Smokeless tobacco: Never Used  . Alcohol Use: 0.0 oz/week    0 Standard drinks or equivalent per week     Comment: occ  . Drug Use: No  . Sexual Activity:    Partners: Male    Birth Control/ Protection: Surgical   Other Topics Concern  . Not on file   Social History Narrative    Review of Systems: Constitutional: Negative for fever, chills, appetite change, weight loss. Positive for  Fatigue. Skin: Negative for rashes or lesions of concern. HENT: Negative for ear pain, ear discharge.nose bleeds Eyes: Negative for pain, discharge, redness, itching and visual disturbance. Neck: Negative for pain, stiffness Respiratory: Positive for cough, shortness of breath,   Cardiovascular: Negative for chest pain. Positive for  palpitations and leg swelling. Gastrointestinal: Negative for abdominal pain,  nausea, vomiting, diarrhea, constipations. Positive for occasion heartburn. Genitourinary: Negative for dysuria, urgency, frequency, hematuria,  Musculoskeletal: Positive  for back pain, joint pain, joint  Swelling. Negative and gait problem.Negative for weakness. Neurological: Negative for dizziness, tremors, seizures, syncope,   light-headedness Positive for occassional numbness of right face and headaches. (usually on the right). Hematological: Negative for easy bruising or bleeding Psychiatric/Behavioral: Negative for depression. Positive for anxiety (worries a lot)   Objective:   Filed Vitals:   01/27/15  1448 01/27/15 1523  BP: 134/71   Pulse: 86 80  Temp: 98.2 F (36.8 C)     Physical Exam: Constitutional: Patient is morbidly obese HENT: Normocephalic, atraumatic, External right and left ear normal. Oropharynx is clear and moist.  Eyes: Conjunctivae and EOM are normal. PERRLA, no scleral icterus. Neck: Normal ROM. Neck supple. No lymphadenopathy, No thyromegaly. CVS: RRR, S1/S2 +, no murmurs, no gallops, no rubs. Pulmonary: Effort and breath sounds normal, no stridor, rhonchi, wheezes, rales.  Abdominal: Unable to do an adequate exam. She is unable to sit on exam table Musculoskeletal: Normal range of motion. No and no tenderness.There is edema of lower legs with rough scaley skin.  Neuro: Alert.Normal muscle tone coordination. Non-focal Skin: Skin is warm and dry. No rash noted. Not diaphoretic. No erythema. No pallor. Psychiatric: Normal mood and affect. Behavior, judgment, thought content normal.  Lab Results  Component Value Date   WBC 9.4 01/05/2015   HGB 12.7 01/05/2015   HCT 40.1 01/05/2015   MCV 103.1* 01/05/2015   PLT 329 01/05/2015   Lab Results  Component Value Date   CREATININE 0.51 01/05/2015   BUN <5* 01/05/2015   NA 138 01/05/2015   K 4.1 01/05/2015   CL 103 01/05/2015   CO2 28 01/05/2015    No results found for: HGBA1C Lipid Panel  No results found for: CHOL, TRIG, HDL, CHOLHDL, VLDL, LDLCALC     Assessment and plan:   1. Health care maintenance  - COMPLETE METABOLIC PANEL WITH GFR - CBC with Differential - Lipid panel  2. Encounter to establish care -have review information presented by the patient and available information from her [facility] chart.  3. Morbid obesity, unspecified obesity type (HCC) - I have explained that her problems with shortness of breath are most likely related to her weight and to her smoking.   4. Migraine headaches -Imitrex 100 mg, #10, one po at onset of headache. May repeat in 2 hours x one.  5. Dyspnea -I have  explained that there is little we can do unless she can lose weight and stop smoking. She is not ready to do so at this time.   Return in about 3 months (around 04/27/2015).  The patient was given clear instructions to go to ER or return to medical center if symptoms don't improve, worsen or new problems develop. The patient verbalized understanding.    Henrietta HooverLinda C Earnie Rockhold FNP  01/27/2015, 3:38 PM

## 2015-02-04 ENCOUNTER — Telehealth: Payer: Self-pay | Admitting: General Practice

## 2015-02-07 ENCOUNTER — Other Ambulatory Visit: Payer: Self-pay

## 2015-02-07 MED ORDER — SUMATRIPTAN SUCCINATE 100 MG PO TABS: ORAL_TABLET | ORAL | Status: AC

## 2015-02-07 NOTE — Telephone Encounter (Signed)
Refill for sumatriptan sent into pharmacy. Thanks!

## 2015-03-17 ENCOUNTER — Inpatient Hospital Stay (HOSPITAL_COMMUNITY)
Admission: EM | Admit: 2015-03-17 | Discharge: 2015-03-23 | DRG: 291 | Disposition: A | Discharge status: Home / Self Care | Payer: Medicaid Other | Attending: Internal Medicine | Admitting: Internal Medicine

## 2015-03-17 ENCOUNTER — Emergency Department (HOSPITAL_COMMUNITY): Payer: Medicaid Other

## 2015-03-17 ENCOUNTER — Encounter (HOSPITAL_COMMUNITY): Payer: Self-pay | Admitting: Emergency Medicine

## 2015-03-17 DIAGNOSIS — J9622 Acute and chronic respiratory failure with hypercapnia: Secondary | ICD-10-CM | POA: Diagnosis present

## 2015-03-17 DIAGNOSIS — I5031 Acute diastolic (congestive) heart failure: Principal | ICD-10-CM | POA: Diagnosis present

## 2015-03-17 DIAGNOSIS — I248 Other forms of acute ischemic heart disease: Secondary | ICD-10-CM | POA: Diagnosis present

## 2015-03-17 DIAGNOSIS — Z8249 Family history of ischemic heart disease and other diseases of the circulatory system: Secondary | ICD-10-CM | POA: Diagnosis not present

## 2015-03-17 DIAGNOSIS — Z833 Family history of diabetes mellitus: Secondary | ICD-10-CM

## 2015-03-17 DIAGNOSIS — R0600 Dyspnea, unspecified: Secondary | ICD-10-CM | POA: Diagnosis not present

## 2015-03-17 DIAGNOSIS — E8770 Fluid overload, unspecified: Secondary | ICD-10-CM | POA: Diagnosis not present

## 2015-03-17 DIAGNOSIS — R0902 Hypoxemia: Secondary | ICD-10-CM | POA: Diagnosis not present

## 2015-03-17 DIAGNOSIS — R7989 Other specified abnormal findings of blood chemistry: Secondary | ICD-10-CM

## 2015-03-17 DIAGNOSIS — Z825 Family history of asthma and other chronic lower respiratory diseases: Secondary | ICD-10-CM

## 2015-03-17 DIAGNOSIS — E662 Morbid (severe) obesity with alveolar hypoventilation: Secondary | ICD-10-CM | POA: Insufficient documentation

## 2015-03-17 DIAGNOSIS — Z6841 Body Mass Index (BMI) 40.0 and over, adult: Secondary | ICD-10-CM | POA: Diagnosis not present

## 2015-03-17 DIAGNOSIS — Z720 Tobacco use: Secondary | ICD-10-CM

## 2015-03-17 DIAGNOSIS — R609 Edema, unspecified: Secondary | ICD-10-CM | POA: Diagnosis present

## 2015-03-17 DIAGNOSIS — J9621 Acute and chronic respiratory failure with hypoxia: Secondary | ICD-10-CM | POA: Diagnosis present

## 2015-03-17 DIAGNOSIS — F17210 Nicotine dependence, cigarettes, uncomplicated: Secondary | ICD-10-CM | POA: Diagnosis present

## 2015-03-17 DIAGNOSIS — R778 Other specified abnormalities of plasma proteins: Secondary | ICD-10-CM | POA: Diagnosis present

## 2015-03-17 DIAGNOSIS — I214 Non-ST elevation (NSTEMI) myocardial infarction: Secondary | ICD-10-CM | POA: Diagnosis not present

## 2015-03-17 DIAGNOSIS — G4733 Obstructive sleep apnea (adult) (pediatric): Secondary | ICD-10-CM | POA: Diagnosis not present

## 2015-03-17 DIAGNOSIS — M1990 Unspecified osteoarthritis, unspecified site: Secondary | ICD-10-CM | POA: Diagnosis present

## 2015-03-17 DIAGNOSIS — I272 Other secondary pulmonary hypertension: Secondary | ICD-10-CM | POA: Diagnosis present

## 2015-03-17 LAB — I-STAT TROPONIN, ED: TROPONIN I, POC: 0.05 ng/mL (ref 0.00–0.08)

## 2015-03-17 LAB — CREATININE, SERUM
Creatinine, Ser: 0.7 mg/dL (ref 0.44–1.00)
GFR calc non Af Amer: >60 mL/min (ref >60)

## 2015-03-17 LAB — CBC WITH DIFFERENTIAL/PLATELET
BASOS PCT: 1 %
Basophils Absolute: 0.1 10*3/uL (ref 0.0–0.1)
EOS ABS: 0.2 10*3/uL (ref 0.0–0.7)
Eosinophils Relative: 3 %
HEMATOCRIT: 41.2 % (ref 36.0–46.0)
HEMOGLOBIN: 12.3 g/dL (ref 12.0–15.0)
Lymphocytes Relative: 27 %
Lymphs Abs: 2 10*3/uL (ref 0.7–4.0)
MCH: 31.3 pg (ref 26.0–34.0)
MCHC: 29.9 g/dL — ABNORMAL LOW (ref 30.0–36.0)
MCV: 104.8 fL — ABNORMAL HIGH (ref 78.0–100.0)
Monocytes Absolute: 0.5 10*3/uL (ref 0.1–1.0)
Monocytes Relative: 7 %
NEUTROS ABS: 4.5 10*3/uL (ref 1.7–7.7)
NEUTROS PCT: 62 %
Platelets: 353 10*3/uL (ref 150–400)
RBC: 3.93 MIL/uL (ref 3.87–5.11)
RDW: 15.2 % (ref 11.5–15.5)
WBC: 7.3 10*3/uL (ref 4.0–10.5)

## 2015-03-17 LAB — BASIC METABOLIC PANEL
ANION GAP: 10 (ref 5–15)
BUN: 8 mg/dL (ref 6–20)
CALCIUM: 9 mg/dL (ref 8.9–10.3)
CHLORIDE: 104 mmol/L (ref 101–111)
CO2: 25 mmol/L (ref 22–32)
CREATININE: 0.61 mg/dL (ref 0.44–1.00)
GFR calc non Af Amer: >60 mL/min (ref >60)
Glucose, Bld: 111 mg/dL — ABNORMAL HIGH (ref 65–99)
Potassium: 4.6 mmol/L (ref 3.5–5.1)
Sodium: 139 mmol/L (ref 135–145)

## 2015-03-17 LAB — TROPONIN I: TROPONIN I: 0.05 ng/mL — AB (ref <0.031)

## 2015-03-17 LAB — CBC
HEMATOCRIT: 39.9 % (ref 36.0–46.0)
Hemoglobin: 12.1 g/dL (ref 12.0–15.0)
MCH: 31.4 pg (ref 26.0–34.0)
MCHC: 30.3 g/dL (ref 30.0–36.0)
MCV: 103.6 fL — AB (ref 78.0–100.0)
PLATELETS: 353 10*3/uL (ref 150–400)
RBC: 3.85 MIL/uL — AB (ref 3.87–5.11)
RDW: 15.1 % (ref 11.5–15.5)
WBC: 10.5 10*3/uL (ref 4.0–10.5)

## 2015-03-17 LAB — D-DIMER, QUANTITATIVE (NOT AT ARMC): D DIMER QUANT: 0.53 ug{FEU}/mL — AB (ref 0.00–0.50)

## 2015-03-17 LAB — BRAIN NATRIURETIC PEPTIDE: B NATRIURETIC PEPTIDE 5: 189.9 pg/mL — AB (ref 0.0–100.0)

## 2015-03-17 MED ORDER — POTASSIUM CHLORIDE CRYS ER 20 MEQ PO TBCR
20.0000 meq | EXTENDED_RELEASE_TABLET | Freq: Two times a day (BID) | ORAL | Status: DC
  Administered 2015-03-17 – 2015-03-23 (×12): 20 meq via ORAL
  Filled 2015-03-17 (×12): qty 1

## 2015-03-17 MED ORDER — ALBUTEROL SULFATE (2.5 MG/3ML) 0.083% IN NEBU: 2.5000 mg | INHALATION_SOLUTION | RESPIRATORY_TRACT | Status: DC | PRN

## 2015-03-17 MED ORDER — ONDANSETRON HCL 4 MG PO TABS: 4.0000 mg | ORAL_TABLET | Freq: Four times a day (QID) | ORAL | Status: DC | PRN

## 2015-03-17 MED ORDER — SODIUM CHLORIDE 0.9 % IV SOLN: 250.0000 mL | INTRAVENOUS | Status: DC | PRN

## 2015-03-17 MED ORDER — ENOXAPARIN SODIUM 120 MG/0.8ML ~~LOC~~ SOLN
120.0000 mg | SUBCUTANEOUS | Status: DC
Start: 2015-03-17 — End: 2015-03-17
  Filled 2015-03-17: qty 0.8

## 2015-03-17 MED ORDER — SALINE SPRAY 0.65 % NA SOLN
1.0000 | NASAL | Status: DC | PRN
  Administered 2015-03-18: 1 via NASAL
  Filled 2015-03-17: qty 44

## 2015-03-17 MED ORDER — GUAIFENESIN-DM 100-10 MG/5ML PO SYRP: 5.0000 mL | ORAL_SOLUTION | ORAL | Status: DC | PRN

## 2015-03-17 MED ORDER — OXYMETAZOLINE HCL 0.05 % NA SOLN
1.0000 | Freq: Two times a day (BID) | NASAL | Status: AC
  Administered 2015-03-18 – 2015-03-20 (×6): 1 via NASAL
  Filled 2015-03-17: qty 15

## 2015-03-17 MED ORDER — ONDANSETRON HCL 4 MG/2ML IJ SOLN: 4.0000 mg | Freq: Four times a day (QID) | INTRAMUSCULAR | Status: DC | PRN

## 2015-03-17 MED ORDER — FLUTICASONE PROPIONATE 50 MCG/ACT NA SUSP
2.0000 | Freq: Every day | NASAL | Status: DC
  Administered 2015-03-18 – 2015-03-22 (×5): 2 via NASAL
  Filled 2015-03-17: qty 16

## 2015-03-17 MED ORDER — ACETAMINOPHEN 650 MG RE SUPP: 650.0000 mg | Freq: Four times a day (QID) | RECTAL | Status: DC | PRN

## 2015-03-17 MED ORDER — ASPIRIN EC 325 MG PO TBEC
325.0000 mg | DELAYED_RELEASE_TABLET | Freq: Every day | ORAL | Status: DC
Start: 2015-03-17 — End: 2015-03-23
  Administered 2015-03-17 – 2015-03-23 (×7): 325 mg via ORAL
  Filled 2015-03-17 (×7): qty 1

## 2015-03-17 MED ORDER — FUROSEMIDE 10 MG/ML IJ SOLN: 60.0000 mg | Freq: Two times a day (BID) | INTRAMUSCULAR | Status: DC

## 2015-03-17 MED ORDER — ENOXAPARIN SODIUM 100 MG/ML ~~LOC~~ SOLN
100.0000 mg | SUBCUTANEOUS | Status: DC
  Administered 2015-03-17: 100 mg via SUBCUTANEOUS
  Filled 2015-03-17: qty 1

## 2015-03-17 MED ORDER — SODIUM CHLORIDE 0.9 % IJ SOLN: 3.0000 mL | INTRAMUSCULAR | Status: DC | PRN

## 2015-03-17 MED ORDER — ACETAMINOPHEN 325 MG PO TABS
650.0000 mg | ORAL_TABLET | Freq: Four times a day (QID) | ORAL | Status: DC | PRN
  Administered 2015-03-19 – 2015-03-20 (×3): 650 mg via ORAL
  Filled 2015-03-17 (×3): qty 2

## 2015-03-17 MED ORDER — NICOTINE 14 MG/24HR TD PT24
14.0000 mg | MEDICATED_PATCH | Freq: Every day | TRANSDERMAL | Status: DC
  Administered 2015-03-18 – 2015-03-23 (×7): 14 mg via TRANSDERMAL
  Filled 2015-03-17 (×7): qty 1

## 2015-03-17 MED ORDER — FUROSEMIDE 10 MG/ML IJ SOLN
60.0000 mg | Freq: Two times a day (BID) | INTRAMUSCULAR | Status: DC
  Administered 2015-03-17 – 2015-03-20 (×7): 60 mg via INTRAVENOUS
  Filled 2015-03-17 (×8): qty 6

## 2015-03-17 MED ORDER — SODIUM CHLORIDE 0.9 % IJ SOLN
3.0000 mL | Freq: Two times a day (BID) | INTRAMUSCULAR | Status: DC
  Administered 2015-03-17 – 2015-03-18 (×2): 3 mL via INTRAVENOUS

## 2015-03-17 MED ORDER — SODIUM CHLORIDE 0.9 % IJ SOLN
3.0000 mL | Freq: Two times a day (BID) | INTRAMUSCULAR | Status: DC
  Administered 2015-03-18 – 2015-03-20 (×4): 3 mL via INTRAVENOUS

## 2015-03-17 MED ORDER — LORATADINE 10 MG PO TABS
10.0000 mg | ORAL_TABLET | Freq: Every day | ORAL | Status: DC
  Administered 2015-03-17 – 2015-03-23 (×7): 10 mg via ORAL
  Filled 2015-03-17 (×7): qty 1

## 2015-03-17 NOTE — Progress Notes (Signed)
RT stuck pt for an ABG and was unable to attain any blood. MD notified.

## 2015-03-17 NOTE — ED Notes (Signed)
Gave pt ice chips and saltine crackers, per Dr. Lynelle Doctor

## 2015-03-17 NOTE — H&P (Addendum)
PATIENT DETAILS Name: Jillian Weber Age: 40 y.o. Sex: female Date of Birth: [birth year] Admit Date: 03/17/2015 PCP:No PCP Per Patient Referring Physician:Dr Lynelle Doctor   CHIEF COMPLAINT:  Exertional dyspnea-3 months, worsening rapidly for the past 2-3 weeks Approximately 90 pound weight gain in the past 3 months   HPI: Jillian Weber is a 40 y.o. female with a Past Medical History of morbid obesity, migraine headaches who presents today with the above noted complaint. Per patient, over the past 3 months or so she's had worsening exertional dyspnea. Initially her dyspnea occurred after walking 20-30 feet, but over the past month or so dyspnea occurs with a few steps. Over the past few days, she gets severe exertional dyspnea on just moving around in the bed. She also claims that approximately 3 months ago she weighed around 505 pounds, but today she weighs here was 590 pounds. She gives a history of progressively worsening lower extremity edema-she claims that it is difficult for her to now lift her weight. She also gives a history of not being able to lie flat because of dyspnea-she claims that she has not been able to sleep-as she wakes up short of breath. Because of rapidly worsening symptoms, however daughter called EMS and she was brought to the hospital for further evaluation and treatment. In the emergency room, she was noted to be hypoxic to the 80s on room air, limited data were unremarkable-I was asked to admit this patient for further evaluation and treatment. She also gives a history of nasal congestion, cough-but denies fever. She does give a history of chest heaviness at times-particularly when she is short of breath. But denies history of active chest pain. No nausea, vomiting, diarrhea. No abdominal pain.   ALLERGIES:  No Known Allergies  PAST MEDICAL HISTORY: Past Medical History  Diagnosis Date  . Obesity   . Bronchitis   . Hypertension   .  Arthritis   . Carpal tunnel syndrome   . Migraine     PAST SURGICAL HISTORY: Past Surgical History  Procedure Laterality Date  . Tonsillectomy    . Tonsillectomy    . Tubal ligation    . Wrist surgery      bilateral, carpal tunnel  . Cholecystectomy      MEDICATIONS AT HOME: Prior to Admission medications   Medication Sig Start Date End Date Taking? Authorizing Provider  Acetaminophen (TYLENOL ARTHRITIS EXT RELIEF PO) Take 1 tablet by mouth 2 (two) times daily.   Yes Historical Provider, MD  albuterol (PROVENTIL HFA;VENTOLIN HFA) 108 (90 BASE) MCG/ACT inhaler Inhale 2 puffs into the lungs every 2 (two) hours as needed for wheezing or shortness of breath (cough). 12/19/14  Yes Doreene Eland, MD  guaiFENesin (MUCINEX) 600 MG 12 hr tablet Take 600 mg by mouth 2 (two) times daily as needed for cough or to loosen phlegm.   Yes Historical Provider, MD  ibuprofen (ADVIL,MOTRIN) 200 MG tablet Take 200 mg by mouth every 6 (six) hours as needed for headache.   Yes Historical Provider, MD  MELATONIN PO Take 2 mLs by mouth daily as needed. For sleep   Yes Historical Provider, MD  meloxicam (MOBIC) 15 MG tablet One by mouth daily with food for arthritis. Patient taking differently: Take 15 mg by mouth daily. One by mouth daily with food for arthritis. 01/27/15  Yes Henrietta Hoover, NP  Menthol, Topical Analgesic, (ICY HOT MEDICATED SPRAY EX) Apply 1 spray topically daily  as needed. For pain   Yes Historical Provider, MD  OVER THE COUNTER MEDICATION Apply 1 application topically daily as needed. Medication: Asperceam.Apply daily as needed  for pain   Yes Historical Provider, MD  Pseudoephed-APAP-Guaifenesin (TYLENOL SINUS SEVERE CONGEST PO) Take 30 mLs by mouth daily as needed. For sever cold symptoms   Yes Historical Provider, MD  SUMAtriptan (IMITREX) 100 MG tablet Take one at earliest onset of migraine headache. May repeat in 2 hours if headache persist. No more than 2 in 24 hours. Patient  taking differently: Take 100 mg by mouth every 2 (two) hours as needed. Take one at earliest onset of migraine headache. May repeat in 2 hours if headache persist. No more than 2 in 24 hours. 02/07/15  Yes Henrietta Hoover, NP    FAMILY HISTORY: Family History  Problem Relation Age of Onset  . Hypertension Mother   . Cancer Mother   . Coronary artery disease Mother   . Osteoarthritis Mother   . Diabetes Father   . Hypertension Father   . Asthma Father   . Asthma Sister   . Diabetes Sister     SOCIAL HISTORY:  reports that she has been smoking Cigarettes.  She has a 10 pack-year smoking history. She has never used smokeless tobacco. She reports that she drinks alcohol. She reports that she does not use illicit drugs. Lives at: Home Mobility: Cane/Walker  REVIEW OF SYSTEMS:  Constitutional:   No  weight loss, night sweats,  Fevers, chills, fatigue.  HEENT:    No headaches, Dysphagia,Tooth/dental problems,Sore throat,  No sneezing, itching, ear ache  Cardio-vascular: No chest pain, palpitations  GI:  No heartburn, indigestion, abdominal pain, nausea, vomiting, diarrhea, melena or hematochezia  Resp: No  hemoptysis,plueritic chest pain.   Skin:  No rash or lesions.  GU:  No dysuria, change in color of urine, no urgency or frequency.  No flank pain.  Musculoskeletal: No joint pain or swelling.  No decreased range of motion.  No back pain.  Endocrine: No heat intolerance, no cold intolerance, no polyuria, no polydipsia  Psych: No change in mood or affect. No depression or anxiety.  No memory loss.   PHYSICAL EXAM: Blood pressure 134/83, pulse 87, temperature 98.2 F (36.8 C), temperature source Oral, resp. rate 30, height  (1.6 m), weight 267.985 kg (590 lb 12.8 oz), SpO2 93 %.  General appearance :Awake, alert, not in any distress. Morbidly obese HEENT: Atraumatic and Normocephalic, pupils equally reactive to light and accomodation Neck: supple Chest:  Moving air-however sounds are very distant. No obvious rhonchi or rales heard  CVS: S1 S2 regular-but heart sounds are very distant  Abdomen:Very obese abdomen-however it appears soft and nontender  . Extremities: B/L Lower Ext shows chronic venous stasis changes. She does have some pitting edema up to her upper leg. Neurology:  Non focal Skin:No Rash Wounds:N/A  LABS ON ADMISSION:   Recent Labs  03/17/15 1523  NA 139  K 4.6  CL 104  CO2 25  GLUCOSE 111*  BUN 8  CREATININE 0.61  CALCIUM 9.0   No results for input(s): AST, ALT, ALKPHOS, BILITOT, PROT, ALBUMIN in the last 72 hours. No results for input(s): LIPASE, AMYLASE in the last 72 hours.  Recent Labs  03/17/15 1523  WBC 7.3  NEUTROABS 4.5  HGB 12.3  HCT 41.2  MCV 104.8*  PLT 353   No results for input(s): CKTOTAL, CKMB, CKMBINDEX, TROPONINI in the last 72 hours.  Recent Labs  03/17/15 1523  DDIMER 0.53*   Invalid input(s): POCBNP   RADIOLOGIC STUDIES ON ADMISSION: Dg Chest 2 View  03/17/2015  CLINICAL DATA:  Shortness of breath, dry cough. EXAM: CHEST  2 VIEW COMPARISON:  January 05, 2015. FINDINGS: Stable cardiomediastinal silhouette. Hypoinflation of the lungs are noted with probable mild right basilar subsegmental atelectasis. No pneumothorax or definite pleural effusion is noted. Overall, this exam is limited due to body habitus. Bony thorax is unremarkable. IMPRESSION: Hypoinflation of the lungs with probable mild right basilar subsegmental atelectasis. Electronically Signed   By: Lupita Raider, M.D.   On: 03/17/2015 15:51    I have personally reviewed images of chest xray   EKG: Personally reviewed.NSR  ASSESSMENT AND PLAN: Present on Admission:  .  Acute on chronic respiratory failure with hypercapnia/Hpoxia: Suspect most of her symptoms are related to obesity hypoventilation syndrome, she probably has right-sided heart failure. Admit to telemetry, place on nocturnal BiPAP. Check baseline  ABG.Nocturnal O2 saturation. Pulmonary consultation will be obtained. Her d-dimer is only minimally elevated, will check lower extremity Dopplers-if negative-doubt any further workup needed(probably will be poor study given weight) . She will not be able to undergo a CT/VQ scan because of her weight. She will likely need outpatient sleep study, but will benefit from case manager evaluation to see if she can get a BiPAP or CPAP on discharge.  . 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 Check echo, place on Lasix. Suspect BNP is falsely low due to obesity. Depending on results of echocardiogram, cardiology may need to be consulted  . Morbid obesity: We will need counseling regarding importance of weight loss. Once more stable, will need referral to bariatric surgery-can be done as outpatient.Please note-patient is aware that most of her presenting symptoms is probably related to obesity.   Further plan will depend as patient's clinical course evolves and further radiologic and laboratory data become available. Patient will be monitored closely.  Please note above plan was formulated after personal review and summarization of most recent inpatient/outpatient records.   Above noted plan was discussed with patient face to face at bedside, she was in agreement.   CONSULTS: PCCM  DVT Prophylaxis: Prophylactic Lovenox (have asked pharmacy to dose)  Code Status: Full Code  Disposition Plan:  Discharge back home 3-5 days,but may warrant SNF depending on clinical course  Total time spent  55 minutes.Greater than 50% of this time was spent in counseling, explanation of diagnosis, planning of further management, and coordination of care.  [facility] Pager [phone]  If 7PM-7AM, please contact night-coverage [email or web address] Password [facility] 03/17/2015, 6:14 PM

## 2015-03-17 NOTE — ED Provider Notes (Signed)
CSN: [id number]     Arrival date & time 03/17/15  1427 History   First MD Initiated Contact with Patient 03/17/15 1428     Chief Complaint  Patient presents with  . Shortness of Breath   HPI Patient presents to the emergency room for evaluation of shortness of breath that started several months ago. Patient states she noticed symptoms back in October. Ever since then she's had persistent issues with shortness of breath. Patient states any activity causes her symptoms to worsen. She denies any trouble with chest pain. She denies any trouble with fevers or chills. She does not have a productive cough. She does have chronic leg swelling. The patient is also morbidly obese and has been gaining weight in the last few years. He went and saw a primary doctor in early December. She was told her symptoms are most likely related to her smoking and her weight. She states she is not feeling any better so she came to the emergency room Past Medical History  Diagnosis Date  . Obesity   . Bronchitis   . Hypertension   . Arthritis   . Carpal tunnel syndrome   . Migraine    Past Surgical History  Procedure Laterality Date  . Tonsillectomy    . Tonsillectomy    . Tubal ligation    . Wrist surgery      bilateral, carpal tunnel  . Cholecystectomy     Family History  Problem Relation Age of Onset  . Hypertension Mother   . Cancer Mother   . Coronary artery disease Mother   . Osteoarthritis Mother   . Diabetes Father   . Hypertension Father   . Asthma Father   . Asthma Sister   . Diabetes Sister    Social History  Substance Use Topics  . Smoking status: Current Every Day Smoker -- 1.00 packs/day for 10 years    Types: Cigarettes  . Smokeless tobacco: Never Used  . Alcohol Use: 0.0 oz/week    0 Standard drinks or equivalent per week     Comment: occ   OB History    No data available     Review of Systems  All other systems reviewed and are negative.     Allergies  Review of  patient's allergies indicates no known allergies.  Home Medications   Prior to Admission medications   Medication Sig Start Date End Date Taking? Authorizing Provider  Acetaminophen (TYLENOL ARTHRITIS EXT RELIEF PO) Take 1 tablet by mouth 2 (two) times daily.   Yes Historical Provider, MD  albuterol (PROVENTIL HFA;VENTOLIN HFA) 108 (90 BASE) MCG/ACT inhaler Inhale 2 puffs into the lungs every 2 (two) hours as needed for wheezing or shortness of breath (cough). 12/19/14  Yes Doreene Eland, MD  guaiFENesin (MUCINEX) 600 MG 12 hr tablet Take 600 mg by mouth 2 (two) times daily as needed for cough or to loosen phlegm.   Yes Historical Provider, MD  ibuprofen (ADVIL,MOTRIN) 200 MG tablet Take 200 mg by mouth every 6 (six) hours as needed for headache.   Yes Historical Provider, MD  MELATONIN PO Take 2 mLs by mouth daily as needed. For sleep   Yes Historical Provider, MD  meloxicam (MOBIC) 15 MG tablet One by mouth daily with food for arthritis. Patient taking differently: Take 15 mg by mouth daily. One by mouth daily with food for arthritis. 01/27/15  Yes Henrietta Hoover, NP  Menthol, Topical Analgesic, (ICY HOT MEDICATED SPRAY EX) Apply 1 spray  topically daily as needed. For pain   Yes Historical Provider, MD  OVER THE COUNTER MEDICATION Apply 1 application topically daily as needed. Medication: Asperceam.Apply daily as needed  for pain   Yes Historical Provider, MD  Pseudoephed-APAP-Guaifenesin (TYLENOL SINUS SEVERE CONGEST PO) Take 30 mLs by mouth daily as needed. For sever cold symptoms   Yes Historical Provider, MD  SUMAtriptan (IMITREX) 100 MG tablet Take one at earliest onset of migraine headache. May repeat in 2 hours if headache persist. No more than 2 in 24 hours. Patient taking differently: Take 100 mg by mouth every 2 (two) hours as needed. Take one at earliest onset of migraine headache. May repeat in 2 hours if headache persist. No more than 2 in 24 hours. 02/07/15  Yes Henrietta Hoover, NP   BP 134/83 mmHg  Pulse 87  Temp(Src) 98.2 F (36.8 C) (Oral)  Resp 30  Ht  (1.6 m)  Wt 267.985 kg  BMI 104.68 kg/m2  SpO2 93% Physical Exam  Constitutional: No distress.  Morbidly obese  HENT:  Head: Normocephalic and atraumatic.  Right Ear: External ear normal.  Left Ear: External ear normal.  Eyes: Conjunctivae are normal. Right eye exhibits no discharge. Left eye exhibits no discharge. No scleral icterus.  Neck: Neck supple. No tracheal deviation present.  Cardiovascular: Normal rate, regular rhythm and intact distal pulses.   Pulmonary/Chest: Effort normal and breath sounds normal. No stridor. No respiratory distress. She has no wheezes. She has no rales.  Abdominal: Soft. Bowel sounds are normal. She exhibits no distension. There is no tenderness. There is no rebound and no guarding.  Musculoskeletal: She exhibits edema. She exhibits no tenderness.  Chronic lymphedema bilateral lower extremities  Neurological: She is alert. She has normal strength. No cranial nerve deficit (no facial droop, extraocular movements intact, no slurred speech) or sensory deficit. She exhibits normal muscle tone. She displays no seizure activity. Coordination normal.  Skin: Skin is warm and dry. No rash noted. She is not diaphoretic.  Dry thickened scaling skin in the lower extremities,  Psychiatric: She has a normal mood and affect.  Nursing note and vitals reviewed.   ED Course  Procedures (including critical care time) Labs Review Labs Reviewed  BASIC METABOLIC PANEL - Abnormal; Notable for the following:    Glucose, Bld 111 (*)    All other components within normal limits  CBC WITH DIFFERENTIAL/PLATELET - Abnormal; Notable for the following:    MCV 104.8 (*)    MCHC 29.9 (*)    All other components within normal limits  BRAIN NATRIURETIC PEPTIDE - Abnormal; Notable for the following:    B Natriuretic Peptide 189.9 (*)    All other components within normal limits   D-DIMER, QUANTITATIVE (NOT AT [facility])  Rosezena Sensor, ED  I-STAT ARTERIAL BLOOD GAS, ED    Imaging Review Dg Chest 2 View  03/17/2015  CLINICAL DATA:  Shortness of breath, dry cough. EXAM: CHEST  2 VIEW COMPARISON:  January 05, 2015. FINDINGS: Stable cardiomediastinal silhouette. Hypoinflation of the lungs are noted with probable mild right basilar subsegmental atelectasis. No pneumothorax or definite pleural effusion is noted. Overall, this exam is limited due to body habitus. Bony thorax is unremarkable. IMPRESSION: Hypoinflation of the lungs with probable mild right basilar subsegmental atelectasis. Electronically Signed   By: Lupita Raider, M.D.   On: 03/17/2015 15:51   I have personally reviewed and evaluated these images and lab results as part of my medical decision-making.  EKG Interpretation   Date/Time:  Thursday March 17 2015 14:33:10 EST Ventricular Rate:  96 PR Interval:  151 QRS Duration: 81 QT Interval:  350 QTC Calculation: 442 R Axis:   -96 Text Interpretation:  Sinus rhythm Left anterior fascicular block Low  voltage, precordial leads No significant change since last tracing  Confirmed by Taiki Buckwalter  MD-J, Annett Boxwell ([id number]) on 03/17/2015 2:46:27 PM      MDM   Final diagnoses:  Hypoxia  Morbid obesity with alveolar hypoventilation (HCC)    Patient's laboratory tests are unremarkable. Troponin is normal. BNP is only 189. Chest x-ray shows hypoventilation without evidence of infiltrate or edema.  However, the patient does remain tachypneic in the ED with respiratory rate of 30 and oxygen saturation of 89% on room air. She is in the mid 90s on nasal cannula.  We attempted to get an ABG however the respiratory therapist was unable to get a sample. D-dimer test was added on.  Patient has hypoxia. I'm concerned that her symptoms may be related to her morbid obesity, obstructive sleep apnea and possibly cor pulmonale.  Pt understands she needs to stop smoking and lose  weight. I will consult with the medical service for admission considering her hypoxia.  She may benefit from echocardiogram, possible pulmonary consult.    Linwood Dibbles, MD 03/17/15 [phone]

## 2015-03-17 NOTE — ED Notes (Signed)
EDP at bedside  

## 2015-03-17 NOTE — ED Notes (Signed)
Pt comes from home per EMS with c/o shortness of breath. Pt has had a continuous cough since October with increasingly shortness of breath. Pt A&Ox4, obese, denies chest pain. VSS. IV Stared en route. Pt has had Albuterol  x2, Atrovent x2, and  Solumedrol.

## 2015-03-17 NOTE — Progress Notes (Signed)
Report received from ED RN. Pt to be admitted to 5 west room 33.

## 2015-03-18 ENCOUNTER — Inpatient Hospital Stay (HOSPITAL_COMMUNITY): Payer: Medicaid Other

## 2015-03-18 DIAGNOSIS — M1990 Unspecified osteoarthritis, unspecified site: Secondary | ICD-10-CM

## 2015-03-18 DIAGNOSIS — R0600 Dyspnea, unspecified: Secondary | ICD-10-CM

## 2015-03-18 DIAGNOSIS — J9622 Acute and chronic respiratory failure with hypercapnia: Secondary | ICD-10-CM

## 2015-03-18 DIAGNOSIS — R7989 Other specified abnormal findings of blood chemistry: Secondary | ICD-10-CM

## 2015-03-18 DIAGNOSIS — J9621 Acute and chronic respiratory failure with hypoxia: Secondary | ICD-10-CM

## 2015-03-18 DIAGNOSIS — G4733 Obstructive sleep apnea (adult) (pediatric): Secondary | ICD-10-CM

## 2015-03-18 DIAGNOSIS — Z720 Tobacco use: Secondary | ICD-10-CM

## 2015-03-18 DIAGNOSIS — R778 Other specified abnormalities of plasma proteins: Secondary | ICD-10-CM | POA: Diagnosis present

## 2015-03-18 DIAGNOSIS — R609 Edema, unspecified: Secondary | ICD-10-CM

## 2015-03-18 DIAGNOSIS — I214 Non-ST elevation (NSTEMI) myocardial infarction: Secondary | ICD-10-CM

## 2015-03-18 DIAGNOSIS — E8770 Fluid overload, unspecified: Secondary | ICD-10-CM | POA: Insufficient documentation

## 2015-03-18 LAB — CBC
HCT: 39.9 % (ref 36.0–46.0)
Hemoglobin: 12.4 g/dL (ref 12.0–15.0)
MCH: 31.7 pg (ref 26.0–34.0)
MCHC: 31.1 g/dL (ref 30.0–36.0)
MCV: 102 fL — ABNORMAL HIGH (ref 78.0–100.0)
PLATELETS: 384 10*3/uL (ref 150–400)
RBC: 3.91 MIL/uL (ref 3.87–5.11)
RDW: 14.9 % (ref 11.5–15.5)
WBC: 11.1 10*3/uL — ABNORMAL HIGH (ref 4.0–10.5)

## 2015-03-18 LAB — BASIC METABOLIC PANEL
ANION GAP: 12 (ref 5–15)
BUN: 7 mg/dL (ref 6–20)
CALCIUM: 9.3 mg/dL (ref 8.9–10.3)
CO2: 29 mmol/L (ref 22–32)
CREATININE: 0.67 mg/dL (ref 0.44–1.00)
Chloride: 98 mmol/L — ABNORMAL LOW (ref 101–111)
GFR calc Af Amer: >60 mL/min (ref >60)
GLUCOSE: 173 mg/dL — AB (ref 65–99)
Potassium: 4.4 mmol/L (ref 3.5–5.1)
Sodium: 139 mmol/L (ref 135–145)

## 2015-03-18 LAB — BLOOD GAS, ARTERIAL
ACID-BASE EXCESS: 6.6 mmol/L — AB (ref 0.0–2.0)
Bicarbonate: 31.7 mEq/L — ABNORMAL HIGH (ref 20.0–24.0)
Drawn by: 280991
O2 CONTENT: 2 L/min
O2 Saturation: 95 %
PATIENT TEMPERATURE: 98.6
PCO2 ART: 55 mmHg — AB (ref 35.0–45.0)
PO2 ART: 80.3 mmHg (ref 80.0–100.0)
TCO2: 33.4 mmol/L (ref 0–100)
pH, Arterial: 7.379 (ref 7.350–7.450)

## 2015-03-18 LAB — HEPATIC FUNCTION PANEL
ALBUMIN: 3.2 g/dL — AB (ref 3.5–5.0)
ALK PHOS: 88 U/L (ref 38–126)
ALT: 24 U/L (ref 14–54)
AST: 26 U/L (ref 15–41)
BILIRUBIN TOTAL: 0.6 mg/dL (ref 0.3–1.2)
Bilirubin, Direct: <0.1 mg/dL — ABNORMAL LOW (ref 0.1–0.5)
Total Protein: 7.6 g/dL (ref 6.5–8.1)

## 2015-03-18 LAB — LIPID PANEL
CHOLESTEROL: 152 mg/dL (ref 0–200)
HDL: 44 mg/dL (ref >40)
LDL Cholesterol: 99 mg/dL (ref 0–99)
TRIGLYCERIDES: 43 mg/dL (ref <150)
Total CHOL/HDL Ratio: 3.5 RATIO
VLDL: 9 mg/dL (ref 0–40)

## 2015-03-18 LAB — URINALYSIS, ROUTINE W REFLEX MICROSCOPIC
Bilirubin Urine: NEGATIVE
GLUCOSE, UA: NEGATIVE mg/dL
KETONES UR: NEGATIVE mg/dL
NITRITE: NEGATIVE
PH: 5 (ref 5.0–8.0)
Protein, ur: NEGATIVE mg/dL
SPECIFIC GRAVITY, URINE: 1.006 (ref 1.005–1.030)

## 2015-03-18 LAB — URINE MICROSCOPIC-ADD ON

## 2015-03-18 LAB — TROPONIN I
TROPONIN I: 0.06 ng/mL — AB (ref <0.031)
Troponin I: 0.03 ng/mL (ref <0.031)

## 2015-03-18 LAB — MAGNESIUM: Magnesium: 2 mg/dL (ref 1.7–2.4)

## 2015-03-18 MED ORDER — ENOXAPARIN SODIUM 150 MG/ML ~~LOC~~ SOLN
130.0000 mg | SUBCUTANEOUS | Status: DC
Start: 2015-03-18 — End: 2015-03-23
  Administered 2015-03-18 – 2015-03-22 (×5): 130 mg via SUBCUTANEOUS
  Filled 2015-03-18 (×6): qty 0.87

## 2015-03-18 NOTE — Consult Note (Signed)
Name: Jillian Weber MRN: [id number] DOB: 09-19-1975    ADMISSION DATE:  03/17/2015 CONSULTATION DATE:  1/20  REFERRING MD : [facility]  CHIEF COMPLAINT:  SOB  BRIEF PATIENT DESCRIPTION: MOAAF  SIGNIFICANT EVENTS    STUDIES:  1/20 2 d>>   HISTORY OF PRESENT ILLNESS:   40 yo mo AAF(582 lbs) with increased SOB over 3 months, + non productive cough, and massive weight gain/ lower ext edema. Family reports classic signs of OSA with apnea, snoring and inability to sleep. [facility] Pulmonary is called for increased dyspnea over 3 months and weight gain. She is a life long smoker. She will need NIMVS, no evidence of OHS at this time, for OSA. Sleep study and ABG are also needed along with 2 d echo are needed and being done. Weight loss of course is most important factor. PCCM will check back on Monday.  PAST MEDICAL HISTORY :   has a past medical history of Obesity; Bronchitis; Hypertension; Arthritis; Carpal tunnel syndrome; and Migraine.  has past surgical history that includes Tonsillectomy; Tonsillectomy; Tubal ligation; Wrist surgery; and Cholecystectomy. Prior to Admission medications   Medication Sig Start Date End Date Taking? Authorizing Provider  Acetaminophen (TYLENOL ARTHRITIS EXT RELIEF PO) Take 1 tablet by mouth 2 (two) times daily.   Yes Historical Provider, MD  albuterol (PROVENTIL HFA;VENTOLIN HFA) 108 (90 BASE) MCG/ACT inhaler Inhale 2 puffs into the lungs every 2 (two) hours as needed for wheezing or shortness of breath (cough). 12/19/14  Yes Doreene Eland, MD  guaiFENesin (MUCINEX) 600 MG 12 hr tablet Take 600 mg by mouth 2 (two) times daily as needed for cough or to loosen phlegm.   Yes Historical Provider, MD  ibuprofen (ADVIL,MOTRIN) 200 MG tablet Take 200 mg by mouth every 6 (six) hours as needed for headache.   Yes Historical Provider, MD  MELATONIN PO Take 2 mLs by mouth daily as needed. For sleep   Yes Historical  Provider, MD  meloxicam (MOBIC) 15 MG tablet One by mouth daily with food for arthritis. Patient taking differently: Take 15 mg by mouth daily. One by mouth daily with food for arthritis. 01/27/15  Yes Henrietta Hoover, NP  Menthol, Topical Analgesic, (ICY HOT MEDICATED SPRAY EX) Apply 1 spray topically daily as needed. For pain   Yes Historical Provider, MD  OVER THE COUNTER MEDICATION Apply 1 application topically daily as needed. Medication: Asperceam.Apply daily as needed  for pain   Yes Historical Provider, MD  Pseudoephed-APAP-Guaifenesin (TYLENOL SINUS SEVERE CONGEST PO) Take 30 mLs by mouth daily as needed. For sever cold symptoms   Yes Historical Provider, MD  SUMAtriptan (IMITREX) 100 MG tablet Take one at earliest onset of migraine headache. May repeat in 2 hours if headache persist. No more than 2 in 24 hours. Patient taking differently: Take 100 mg by mouth every 2 (two) hours as needed. Take one at earliest onset of migraine headache. May repeat in 2 hours if headache persist. No more than 2 in 24 hours. 02/07/15  Yes Henrietta Hoover, NP   No Known Allergies  FAMILY HISTORY:  family history includes Asthma in her father and sister; Cancer in her mother; Coronary artery disease in her mother; Diabetes in her father and sister; Hypertension in her father and mother; Osteoarthritis in her mother. SOCIAL HISTORY:  reports that she has been smoking Cigarettes.  She has a 5 pack-year smoking history. She has never used smokeless tobacco. She  reports that she drinks alcohol. She reports that she does not use illicit drugs.  REVIEW OF SYSTEMS:   10 point review of system taken, please see HPI for positives and negatives.   SUBJECTIVE:   VITAL SIGNS: Temp:  [97.9 F (36.6 C)-98.4 F (36.9 C)] 98.4 F (36.9 C) (01/20 0709) Pulse Rate:  [80-91] 80 (01/20 0711) Resp:  [16-30] 16 (01/20 0709) BP: (117-147)/(50-83) 147/78 mmHg (01/20 0828) SpO2:  [82 %-100 %] 99 % (01/20  0835) Weight:  [468 lb 6.4 oz (212.465 kg)-590 lb 12.8 oz (267.985 kg)] 582 lb 1.6 oz (264.039 kg) (01/20 0826)  PHYSICAL EXAMINATION: General: Massively obese AAF Neuro:  Intact HEENT: No neck Cardiovascular:  HSD Lungs:  CTA Abdomen:  Obese Musculoskeletal:  intact Skin:  Warm, massive edema   Recent Labs Lab 03/17/15 1523 03/17/15 2118 03/18/15 0037  NA 139  --  139  K 4.6  --  4.4  CL 104  --  98*  CO2 25  --  29  BUN 8  --  7  CREATININE 0.61 0.70 0.67  GLUCOSE 111*  --  173*    Recent Labs Lab 03/17/15 1523 03/17/15 2118 03/18/15 0037  HGB 12.3 12.1 12.4  HCT 41.2 39.9 39.9  WBC 7.3 10.5 11.1*  PLT 353 353 384   Dg Chest 2 View  03/17/2015  CLINICAL DATA:  Shortness of breath, dry cough. EXAM: CHEST  2 VIEW COMPARISON:  January 05, 2015. FINDINGS: Stable cardiomediastinal silhouette. Hypoinflation of the lungs are noted with probable mild right basilar subsegmental atelectasis. No pneumothorax or definite pleural effusion is noted. Overall, this exam is limited due to body habitus. Bony thorax is unremarkable. IMPRESSION: Hypoinflation of the lungs with probable mild right basilar subsegmental atelectasis. Electronically Signed   By: Lupita Raider, M.D.   On: 03/17/2015 15:51    ASSESSMENT    Hypoxia   Dyspnea   OSA (obstructive sleep apnea)   Morbid obesity (HCC)   Arthritis   Elevated troponin   Volume overload   Tobacco abuse  Discussion: 40 yo mo AAF(582 lbs) with increased SOB over 3 months, + non productive cough, and massive weight gain/ lower ext edema. Family reports classic signs of OSA with apnea, snoring and inability to sleep. [facility] Pulmonary is called for increased dyspnea over 3 months and weight gain. She is a life long smoker. She will need NIMVS, no evidence of OHS at this time, for OSA. Sleep study and ABG are also needed along with 2 d echo are needed and being done. Weight loss of course is  most important factor. PCCM will check back on Monday.    PLAN:  -O 2 as needed -Nocturnal Cpap -ABG -2 d echo -stop smoking -weight loss  Brett Canales Minor ACNP [facility] PCCM Pager [phone] till 3 pm If no answer page [phone] 03/18/2015, 12:46 PM  STAFF NOTE: I, Rory Percy, MD FACP have personally reviewed patient's available data, including medical history, events of note, physical examination and test results as part of my evaluation. I have discussed with resident/NP and other care providers such as pharmacist, RN and RRT. In addition, I personally evaluated patient and elicited key findings of: Morbid obesity on examination, edema, significant wt gain associated with worsening snoring, apnea described, coarse reduced BS, no wheezing, she requires nocturnal NIMV, given her size and rise bicarb, would favor bipap nocurnal, assess echo for diastolic dysfxn and assess pa pressures, continue  lasix, she has already feeling better with lasix, appears rebal fxn can tolerate significant diuresis further, will need outpt sleep study, agree tsh with wt gain, ui updated pt and daughter, will follow up Monday  Mcarthur Rossetti. Tyson Alias, MD, FACP Pgr: [phone] [facility] 03/18/2015 4:05 PM

## 2015-03-18 NOTE — Clinical Documentation Improvement (Addendum)
Hospitalist, Cardiology, CCM and/or Associates  (Query responses must be documented in the progress notes and discharge summary, not on the CDI BPA.)  "CHF", "Heart Failure" and probable Right-sided Heart Failure" are documented in the current medical record.  If known or able to determine, please document the Acuity and Type of Heart Failure.  Clinical Information: Please refer to dictated Echo Report dated 03/18/15 LV size normal, Wall thickness normal, EF 60 to 65%, Grade 1 diastolic dysfunction Trivial MR RV normal size with normal systolic function  Please exercise your independent, professional judgment when responding. A specific answer is not anticipated or expected.   Thank You, Jerral Ralph  RN BSN [facility]S [phone] [facility] Information Management [place]

## 2015-03-18 NOTE — Progress Notes (Signed)
Utilization review completed. Vivion Romano, RN, BSN. 

## 2015-03-18 NOTE — Progress Notes (Signed)
VASCULAR LAB PRELIMINARY  PRELIMINARY  PRELIMINARY  PRELIMINARY  Bilateral lower extremity venous duplex  completed.    Preliminary report:  Bilateral:  No obvious evidence of DVT, superficial thrombosis, or Baker's Cyst.  Technically limited by body habitus.  Not all veins visualized.    Shanise Balch, RVT 03/18/2015, 3:34 PM

## 2015-03-18 NOTE — Progress Notes (Signed)
PATIENT DETAILS Name: Jillian Weber Age: 40 y.o. Sex: female Date of Birth: 1975-07-18 Admit Date: 03/17/2015 Admitting Physician Dewayne Shorter Levora Dredge, MD PCP:No PCP Per Patient  Subjective: Shortness of breath is better-ambulating to the bathroom now-but makes her SOB  Assessment/Plan: Active Problems: Acute on chronic respiratory failure with hypercapnia/Hpoxia:secondary to OHS/OSA with some component of CHF. Continue BiPAP, IV Lasix. Await Echo/Lower Ext dopplers. Await PCCM eval. Have asked Case Management to assist regarding BiPAP on d/c and sleep study as outpatient.   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 Neg Balance of 2 L so far, follow weights.  Cards following.  Elevated Troponin:trend not consistent with ACS, suspect demand ischemia from CHF/OHS/OSA. Await Echo. Plan to manage medically  Morbid obesity: Counseled regarding importance of weight loss. Bariatric surgery referral as outpatient.   Disposition: Remain inpatient  Antimicrobial agents  See below  Anti-infectives    None      DVT Prophylaxis: Prophylactic Lovenox  Code Status: Full code   Family Communication None at bedside  Procedures: None  CONSULTS:  cardiology and pulmonary/intensive care  Time spent 30 minutes-Greater than 50% of this time was spent in counseling, explanation of diagnosis, planning of further management, and coordination of care.  MEDICATIONS: Scheduled Meds: . aspirin EC  325 mg Oral Daily  . enoxaparin (LOVENOX) injection  100 mg Subcutaneous Q24H  . fluticasone  2 spray Each Nare Daily  . furosemide  60 mg Intravenous Q12H  . loratadine  10 mg Oral Daily  . nicotine  14 mg Transdermal Daily  . oxymetazoline  1 spray Each Nare BID  . potassium chloride  20 mEq Oral BID  . sodium chloride  3 mL Intravenous Q12H  . sodium chloride  3 mL Intravenous Q12H   Continuous Infusions:  PRN  Meds:.sodium chloride, acetaminophen **OR** acetaminophen, albuterol, guaiFENesin-dextromethorphan, ondansetron **OR** ondansetron (ZOFRAN) IV, sodium chloride, sodium chloride    PHYSICAL EXAM: Vital signs in last 24 hours: Filed Vitals:   03/18/15 0826 03/18/15 0827 03/18/15 0828 03/18/15 0835  BP:   147/78   Pulse:      Temp:      TempSrc:      Resp:      Height:      Weight: 264.039 kg (582 lb 1.6 oz)     SpO2: 82% 88% 92% 99%    Weight change:  Filed Weights   03/17/15 1510 03/17/15 1954 03/18/15 0826  Weight: 267.985 kg (590 lb 12.8 oz) 212.465 kg (468 lb 6.4 oz) 264.039 kg (582 lb 1.6 oz)   Body mass index is 101.48 kg/(m^2).   Gen Exam: Awake and alert with clear speech.  Neck: Supple, No JVD.   Chest: B/L Clear-but very distant CVS: S1 S2 Regular, no murmurs.  Abdomen: Obese abdomen-soft, BS +, non tender, non distended.  Extremities: ++ edema, lower extremities warm to touch. Neurologic: Non Focal.   Skin: No Rash.   Wounds: N/A.    Intake/Output from previous day:  Intake/Output Summary (Last 24 hours) at 03/18/15 0952 Last data filed at 03/18/15 0836  Gross per 24 hour  Intake    100 ml  Output   2175 ml  Net  -2075 ml     LAB RESULTS: CBC  Recent Labs Lab 03/17/15 1523 03/17/15 2118 03/18/15 0037  WBC 7.3 10.5 11.1*  HGB 12.3 12.1 12.4  HCT 41.2 39.9 39.9  PLT 353 353 384  MCV 104.8* 103.6* 102.0*  MCH 31.3 31.4 31.7  MCHC 29.9* 30.3 31.1  RDW 15.2 15.1 14.9  LYMPHSABS 2.0  --   --   MONOABS 0.5  --   --   EOSABS 0.2  --   --   BASOSABS 0.1  --   --     Chemistries   Recent Labs Lab 03/17/15 1523 03/17/15 2118 03/18/15 0037  NA 139  --  139  K 4.6  --  4.4  CL 104  --  98*  CO2 25  --  29  GLUCOSE 111*  --  173*  BUN 8  --  7  CREATININE 0.61 0.70 0.67  CALCIUM 9.0  --  9.3    CBG: No results for input(s): GLUCAP in the last 168 hours.  GFR Estimated Creatinine Clearance: 205.4 mL/min (by C-G formula based on Cr of  0.67).  Coagulation profile No results for input(s): INR, PROTIME in the last 168 hours.  Cardiac Enzymes  Recent Labs Lab 03/17/15 2118 03/18/15 0037 03/18/15 0724  TROPONINI 0.05* 0.65* 0.06*    Invalid input(s): POCBNP  Recent Labs  03/17/15 1523  DDIMER 0.53*   No results for input(s): HGBA1C in the last 72 hours.  Recent Labs  03/18/15 0037  CHOL 152  HDL 44  LDLCALC 99  TRIG 43  CHOLHDL 3.5   No results for input(s): TSH, T4TOTAL, T3FREE, THYROIDAB in the last 72 hours.  Invalid input(s): FREET3 No results for input(s): VITAMINB12, FOLATE, FERRITIN, TIBC, IRON, RETICCTPCT in the last 72 hours. No results for input(s): LIPASE, AMYLASE in the last 72 hours.  Urine Studies No results for input(s): UHGB, CRYS in the last 72 hours.  Invalid input(s): UACOL, UAPR, USPG, UPH, UTP, UGL, UKET, UBIL, UNIT, UROB, ULEU, UEPI, UWBC, URBC, UBAC, CAST, UCOM, BILUA  MICROBIOLOGY: No results found for this or any previous visit (from the past 240 hour(s)).  RADIOLOGY STUDIES/RESULTS: Dg Chest 2 View  03/17/2015  CLINICAL DATA:  Shortness of breath, dry cough. EXAM: CHEST  2 VIEW COMPARISON:  January 05, 2015. FINDINGS: Stable cardiomediastinal silhouette. Hypoinflation of the lungs are noted with probable mild right basilar subsegmental atelectasis. No pneumothorax or definite pleural effusion is noted. Overall, this exam is limited due to body habitus. Bony thorax is unremarkable. IMPRESSION: Hypoinflation of the lungs with probable mild right basilar subsegmental atelectasis. Electronically Signed   By: Lupita Raider, M.D.   On: 03/17/2015 15:51    Jeoffrey Massed, MD  [facility] Pager:[phone]  If 7PM-7AM, please contact night-coverage [email or web address] Password [email or web address] 03/18/2015, 9:52 AM   LOS: 1 day

## 2015-03-18 NOTE — Consult Note (Signed)
Reason for Consult: elevated troponin and exertional dyspnea Primary Cardiologist: new Referring Physician: Dr. Magdalene Molly is an 40 y.o. female.  HPI: Ms. Deerman is a 40 yo woman with PMH of obesity, migraine headaches who presents with 3 months of worsening exertional dyspnea. She says the symptoms now occur in as few as 20-30 feet and sometimes with just a few steps. She also endorses significant weight gain from 505 lbs to 590 lbs.   She also endorses lower extremity edema, weakness, orthopnea ~ 3-4 pillows. She doesn't sleep well 2/2 comfort and breathing. She was hypoxic in the ER to 80s. She also endorses occasional chest heaviness with exertion. No infectious symptoms of fever/chills/nausea/vomiting/diarrhea, no syncope, no diarrhea, no BRBPR or melena.     Past Medical History  Diagnosis Date  . Obesity   . Bronchitis   . Hypertension   . Arthritis   . Carpal tunnel syndrome   . Migraine     Past Surgical History  Procedure Laterality Date  . Tonsillectomy    . Tonsillectomy    . Tubal ligation    . Wrist surgery      bilateral, carpal tunnel  . Cholecystectomy      Family History  Problem Relation Age of Onset  . Hypertension Mother   . Cancer Mother   . Coronary artery disease Mother   . Osteoarthritis Mother   . Diabetes Father   . Hypertension Father   . Asthma Father   . Asthma Sister   . Diabetes Sister     Social History:  reports that she has been smoking Cigarettes.  She has a 5 pack-year smoking history. She has never used smokeless tobacco. She reports that she drinks alcohol. She reports that she does not use illicit drugs.  Allergies: No Known Allergies  Medications:  I have reviewed the patient's current medications. Prior to Admission:  Prescriptions prior to admission  Medication Sig Dispense Refill Last Dose  . Acetaminophen (TYLENOL ARTHRITIS EXT RELIEF PO) Take 1 tablet by mouth 2 (two) times daily.   03/17/2015 at  Unknown time  . albuterol (PROVENTIL HFA;VENTOLIN HFA) 108 (90 BASE) MCG/ACT inhaler Inhale 2 puffs into the lungs every 2 (two) hours as needed for wheezing or shortness of breath (cough). 1 Inhaler 0 03/17/2015 at Unknown time  . guaiFENesin (MUCINEX) 600 MG 12 hr tablet Take 600 mg by mouth 2 (two) times daily as needed for cough or to loosen phlegm.   Past Week at Unknown time  . ibuprofen (ADVIL,MOTRIN) 200 MG tablet Take 200 mg by mouth every 6 (six) hours as needed for headache.   03/17/2015 at Unknown time  . MELATONIN PO Take 2 mLs by mouth daily as needed. For sleep   Past Week at Unknown time  . meloxicam (MOBIC) 15 MG tablet One by mouth daily with food for arthritis. (Patient taking differently: Take 15 mg by mouth daily. One by mouth daily with food for arthritis.) 30 tablet 2 03/16/2015 at Unknown time  . Menthol, Topical Analgesic, (ICY HOT MEDICATED SPRAY EX) Apply 1 spray topically daily as needed. For pain   Past Week at Unknown time  . OVER THE COUNTER MEDICATION Apply 1 application topically daily as needed. Medication: Asperceam.Apply daily as needed  for pain   Past Week at Unknown time  . Pseudoephed-APAP-Guaifenesin (TYLENOL SINUS SEVERE CONGEST PO) Take 30 mLs by mouth daily as needed. For sever cold symptoms   Past Week at Unknown time  .  SUMAtriptan (IMITREX) 100 MG tablet Take one at earliest onset of migraine headache. May repeat in 2 hours if headache persist. No more than 2 in 24 hours. (Patient taking differently: Take 100 mg by mouth every 2 (two) hours as needed. Take one at earliest onset of migraine headache. May repeat in 2 hours if headache persist. No more than 2 in 24 hours.) 10 tablet 0 Past Week at Unknown time   Scheduled: . aspirin EC  325 mg Oral Daily  . enoxaparin (LOVENOX) injection  100 mg Subcutaneous Q24H  . fluticasone  2 spray Each Nare Daily  . furosemide  60 mg Intravenous Q12H  . loratadine  10 mg Oral Daily  . nicotine  14 mg Transdermal Daily    . oxymetazoline  1 spray Each Nare BID  . potassium chloride  20 mEq Oral BID  . sodium chloride  3 mL Intravenous Q12H  . sodium chloride  3 mL Intravenous Q12H    Results for orders placed or performed during the hospital encounter of 03/17/15 (from the past 48 hour(s))  Basic metabolic panel     Status: Abnormal   Collection Time: 03/17/15  3:23 PM  Result Value Ref Range   Sodium 139 135 - 145 mmol/L   Potassium 4.6 3.5 - 5.1 mmol/L   Chloride 104 101 - 111 mmol/L   CO2 25 22 - 32 mmol/L   Glucose, Bld 111 (H) 65 - 99 mg/dL   BUN 8 6 - 20 mg/dL   Creatinine, Ser 0.61 0.44 - 1.00 mg/dL   Calcium 9.0 8.9 - 10.3 mg/dL   GFR calc non Af Amer >60 >60 mL/min   GFR calc Af Amer >60 >60 mL/min    Comment: (NOTE) The eGFR has been calculated using the CKD EPI equation. This calculation has not been validated in all clinical situations. eGFR's persistently <60 mL/min signify possible Chronic Kidney Disease.    Anion gap 10 5 - 15  CBC with Differential     Status: Abnormal   Collection Time: 03/17/15  3:23 PM  Result Value Ref Range   WBC 7.3 4.0 - 10.5 K/uL   RBC 3.93 3.87 - 5.11 MIL/uL   Hemoglobin 12.3 12.0 - 15.0 g/dL   HCT 41.2 36.0 - 46.0 %   MCV 104.8 (H) 78.0 - 100.0 fL   MCH 31.3 26.0 - 34.0 pg   MCHC 29.9 (L) 30.0 - 36.0 g/dL   RDW 15.2 11.5 - 15.5 %   Platelets 353 150 - 400 K/uL   Neutrophils Relative % 62 %   Neutro Abs 4.5 1.7 - 7.7 K/uL   Lymphocytes Relative 27 %   Lymphs Abs 2.0 0.7 - 4.0 K/uL   Monocytes Relative 7 %   Monocytes Absolute 0.5 0.1 - 1.0 K/uL   Eosinophils Relative 3 %   Eosinophils Absolute 0.2 0.0 - 0.7 K/uL   Basophils Relative 1 %   Basophils Absolute 0.1 0.0 - 0.1 K/uL  D-dimer, quantitative (not at [facility])     Status: Abnormal   Collection Time: 03/17/15  3:23 PM  Result Value Ref Range   D-Dimer, Quant 0.53 (H) 0.00 - 0.50 ug/mL-FEU    Comment: (NOTE) At the manufacturer cut-off of 0.50 ug/mL FEU, this assay has been documented  to exclude PE with a sensitivity and negative predictive value of 97 to 99%.  At this time, this assay has not been approved by the FDA to exclude DVT/VTE. Results should be correlated with clinical  presentation.   I-stat troponin, ED     Status: None   Collection Time: 03/17/15  3:28 PM  Result Value Ref Range   Troponin i, poc 0.05 0.00 - 0.08 ng/mL   Comment 3            Comment: Due to the release kinetics of cTnI, a negative result within the first hours of the onset of symptoms does not rule out myocardial infarction with certainty. If myocardial infarction is still suspected, repeat the test at appropriate intervals.   Brain natriuretic peptide     Status: Abnormal   Collection Time: 03/17/15  3:30 PM  Result Value Ref Range   B Natriuretic Peptide 189.9 (H) 0.0 - 100.0 pg/mL  CBC     Status: Abnormal   Collection Time: 03/17/15  9:18 PM  Result Value Ref Range   WBC 10.5 4.0 - 10.5 K/uL   RBC 3.85 (L) 3.87 - 5.11 MIL/uL   Hemoglobin 12.1 12.0 - 15.0 g/dL   HCT 39.9 36.0 - 46.0 %   MCV 103.6 (H) 78.0 - 100.0 fL   MCH 31.4 26.0 - 34.0 pg   MCHC 30.3 30.0 - 36.0 g/dL   RDW 15.1 11.5 - 15.5 %   Platelets 353 150 - 400 K/uL  Creatinine, serum     Status: None   Collection Time: 03/17/15  9:18 PM  Result Value Ref Range   Creatinine, Ser 0.70 0.44 - 1.00 mg/dL   GFR calc non Af Amer >60 >60 mL/min   GFR calc Af Amer >60 >60 mL/min    Comment: (NOTE) The eGFR has been calculated using the CKD EPI equation. This calculation has not been validated in all clinical situations. eGFR's persistently <60 mL/min signify possible Chronic Kidney Disease.   Troponin I (q 6hr x 3)     Status: Abnormal   Collection Time: 03/17/15  9:18 PM  Result Value Ref Range   Troponin I 0.05 (H) <0.031 ng/mL    Comment:        PERSISTENTLY INCREASED TROPONIN VALUES IN THE RANGE OF 0.04-0.49 ng/mL CAN BE SEEN IN:       -UNSTABLE ANGINA       -CONGESTIVE HEART FAILURE       -MYOCARDITIS        -CHEST TRAUMA       -ARRYHTHMIAS       -LATE PRESENTING MYOCARDIAL INFARCTION       -COPD   CLINICAL FOLLOW-UP RECOMMENDED.   Basic metabolic panel     Status: Abnormal   Collection Time: 03/18/15 12:37 AM  Result Value Ref Range   Sodium 139 135 - 145 mmol/L   Potassium 4.4 3.5 - 5.1 mmol/L   Chloride 98 (L) 101 - 111 mmol/L   CO2 29 22 - 32 mmol/L   Glucose, Bld 173 (H) 65 - 99 mg/dL   BUN 7 6 - 20 mg/dL   Creatinine, Ser 0.67 0.44 - 1.00 mg/dL   Calcium 9.3 8.9 - 10.3 mg/dL   GFR calc non Af Amer >60 >60 mL/min   GFR calc Af Amer >60 >60 mL/min    Comment: (NOTE) The eGFR has been calculated using the CKD EPI equation. This calculation has not been validated in all clinical situations. eGFR's persistently <60 mL/min signify possible Chronic Kidney Disease.    Anion gap 12 5 - 15  CBC     Status: Abnormal   Collection Time: 03/18/15 12:37 AM  Result Value Ref Range  WBC 11.1 (H) 4.0 - 10.5 K/uL   RBC 3.91 3.87 - 5.11 MIL/uL   Hemoglobin 12.4 12.0 - 15.0 g/dL   HCT 39.9 36.0 - 46.0 %   MCV 102.0 (H) 78.0 - 100.0 fL   MCH 31.7 26.0 - 34.0 pg   MCHC 31.1 30.0 - 36.0 g/dL   RDW 14.9 11.5 - 15.5 %   Platelets 384 150 - 400 K/uL  Lipid panel     Status: None   Collection Time: 03/18/15 12:37 AM  Result Value Ref Range   Cholesterol 152 0 - 200 mg/dL   Triglycerides 43 <150 mg/dL   HDL 44 >40 mg/dL   Total CHOL/HDL Ratio 3.5 RATIO   VLDL 9 0 - 40 mg/dL   LDL Cholesterol 99 0 - 99 mg/dL    Comment:        Total Cholesterol/HDL:CHD Risk Coronary Heart Disease Risk Table                     Men   Women  1/2 Average Risk   3.4   3.3  Average Risk       5.0   4.4  2 X Average Risk   9.6   7.1  3 X Average Risk  23.4   11.0        Use the calculated Patient Ratio above and the CHD Risk Table to determine the patient's CHD Risk.        ATP III CLASSIFICATION (LDL):  <100     mg/dL   Optimal  100-129  mg/dL   Near or Above                    Optimal  130-159   mg/dL   Borderline  160-189  mg/dL   High  >190     mg/dL   Very High   Hepatic function panel     Status: Abnormal   Collection Time: 03/18/15 12:37 AM  Result Value Ref Range   Total Protein 7.6 6.5 - 8.1 g/dL   Albumin 3.2 (L) 3.5 - 5.0 g/dL   AST 26 15 - 41 U/L   ALT 24 14 - 54 U/L   Alkaline Phosphatase 88 38 - 126 U/L   Total Bilirubin 0.6 0.3 - 1.2 mg/dL   Bilirubin, Direct <0.1 (L) 0.1 - 0.5 mg/dL   Indirect Bilirubin NOT CALCULATED 0.3 - 0.9 mg/dL  Troponin I (q 6hr x 3)     Status: Abnormal   Collection Time: 03/18/15 12:37 AM  Result Value Ref Range   Troponin I 0.65 (HH) <0.031 ng/mL    Comment:        POSSIBLE MYOCARDIAL ISCHEMIA. SERIAL TESTING RECOMMENDED. CRITICAL RESULT CALLED TO, READ BACK BY AND VERIFIED WITH: GARNETT E,RN 03/18/15 0130 WAYK   Urinalysis, Routine w reflex microscopic (not at [facility])     Status: Abnormal   Collection Time: 03/18/15  2:18 AM  Result Value Ref Range   Color, Urine YELLOW YELLOW   APPearance CLOUDY (A) CLEAR   Specific Gravity, Urine 1.006 1.005 - 1.030   pH 5.0 5.0 - 8.0   Glucose, UA NEGATIVE NEGATIVE mg/dL   Hgb urine dipstick TRACE (A) NEGATIVE   Bilirubin Urine NEGATIVE NEGATIVE   Ketones, ur NEGATIVE NEGATIVE mg/dL   Protein, ur NEGATIVE NEGATIVE mg/dL   Nitrite NEGATIVE NEGATIVE   Leukocytes, UA TRACE (A) NEGATIVE  Urine microscopic-add on     Status: Abnormal  Collection Time: 03/18/15  2:18 AM  Result Value Ref Range   Squamous Epithelial / LPF 0-5 (A) NONE SEEN   WBC, UA 0-5 0 - 5 WBC/hpf   RBC / HPF 0-5 0 - 5 RBC/hpf   Bacteria, UA RARE (A) NONE SEEN    Dg Chest 2 View  03/17/2015  CLINICAL DATA:  Shortness of breath, dry cough. EXAM: CHEST  2 VIEW COMPARISON:  January 05, 2015. FINDINGS: Stable cardiomediastinal silhouette. Hypoinflation of the lungs are noted with probable mild right basilar subsegmental atelectasis. No pneumothorax or definite pleural effusion is noted. Overall, this exam is limited due to  body habitus. Bony thorax is unremarkable. IMPRESSION: Hypoinflation of the lungs with probable mild right basilar subsegmental atelectasis. Electronically Signed   By: Marijo Conception, M.D.   On: 03/17/2015 15:51    Review of Systems  Constitutional: Positive for malaise/fatigue. Negative for fever, chills and weight loss.  Eyes: Negative for double vision and photophobia.  Respiratory: Positive for shortness of breath.   Cardiovascular: Positive for chest pain, orthopnea and leg swelling.  Gastrointestinal: Negative for nausea, vomiting and abdominal pain.  Genitourinary: Negative for dysuria, frequency and hematuria.  Musculoskeletal: Negative for myalgias and neck pain.  Skin: Negative for rash.  Neurological: Positive for weakness and headaches. Negative for tingling, tremors and sensory change.  Endo/Heme/Allergies: Negative for polydipsia. Does not bruise/bleed easily.  Psychiatric/Behavioral: Negative for suicidal ideas, hallucinations and substance abuse.   Blood pressure 117/50, pulse 88, temperature 98.1 F (36.7 C), temperature source Oral, resp. rate 20, height 5' 3.5" (1.613 m), weight 212.465 kg (468 lb 6.4 oz), last menstrual period 03/13/2015, SpO2 95 %. Physical Exam  Nursing note and vitals reviewed. Constitutional: She is oriented to person, place, and time. No distress.  Pleasant, morbidly obese woman  HENT:  Head: Normocephalic and atraumatic.  Nose: Nose normal.  Mouth/Throat: Oropharynx is clear and moist. No oropharyngeal exudate.  Eyes: Conjunctivae and EOM are normal. Pupils are equal, round, and reactive to light. No scleral icterus.  Neck: Normal range of motion. Neck supple. No tracheal deviation present.  Unable to assess JVP given body habitus  Cardiovascular: Normal rate, regular rhythm and intact distal pulses.  Exam reveals no gallop.   No murmur heard. Distant heart sounds  Respiratory: Effort normal and breath sounds normal. No respiratory  distress. She has no wheezes. She has no rales.  Decreased BS Wearing CPAP  GI: Soft. Bowel sounds are normal. She exhibits no distension. There is no tenderness. There is no rebound.  obese  Musculoskeletal: Normal range of motion. She exhibits edema. She exhibits no tenderness.  Neurological: She is alert and oriented to person, place, and time. No cranial nerve deficit. Coordination normal.  Skin: Skin is warm and dry. No rash noted. She is not diaphoretic. No erythema.  Psychiatric: She has a normal mood and affect. Her behavior is normal. Thought content normal.   Labs reviewed; BUN/CR 7/0.67, ast/alt 26/24, Trop 0.05 to 0.65 ldl 99, BNP 190, total protein 7.6 EKG reviewed; SR, nonspecific ST changes, low voltage precordial leads Assessment/Plan: Ms. Zellars is a 40 yo woman with PMH of obesity, migraine headaches who presents with 3 months of worsening exertional dyspnea who also found to have mildly elevated troponin along with significant weight gain. Differential diagnosis for elevated troponin includes renal failure with poor clearance, vasospasm, demand ischemia (Type II NSTEMI), ACS/NSTEMI, pulmonary embolism, heart failure among many other etiologies. I favor a diagnosis of type II NSTEMI  in setting of respiratory failure or potentially some level of heart failure or other stressors given her age. However, she could very well have ACS. She is on lovenox given at 22:00. For now, would continue to trend cardiac biomarkers and lovenox. Her body habitus and weight preclude further evaluation of PE and will significantly limit echocardiogram. She may be overweight for the cath lab should we consider coronary angiogram.   Problem List Chest Pain NSTEMI - type I vs. Type II Elevated troponin Weight gain Acute on chronic respiratory failure with hypercapna Exertional Dyspnea Plan: - trend cardiac biomarkers, telemetry - echocardiogram this AM - obtain TSH, hba1c, BNP - continue to  assess causes of symptoms and weight gain - reasonable to continue anticoagulation with lovenox or heparin for now - daily aspirin 81 mg - lovenox bid (if weight allows) vs. Heparin gtt  Delmon Andrada 03/18/2015, 5:21 AM

## 2015-03-18 NOTE — Progress Notes (Addendum)
CM received consult: BIPAP on discharge Needs appt for sleep study. [facility] @ [phone] for sleep study appointment. Appointment scheduled for 05/12/2015 @ 8pm, and  placed on AVS per CM.   Gae Gallop RN,BSN,CM [phone]

## 2015-03-18 NOTE — Progress Notes (Addendum)
Agree with Dr. Landry Dyke note.  Patient is morbidly obese with recent increase in SOB associated with weight gain.  Minimal elevation in Troponin with flat trend most c/w demand ischemia from a component of CHF as well as possible chronic hypoxia from obesity hypoventilation syndrome with probable right heart failure.  Continue to cycle enzymes.  2D echo pending.  Continue Lasix. She also had a 9 beat run of WCT.  Await results of echo.  Continue to monitor on tele.  Lytes normal. Check Mg

## 2015-03-18 NOTE — Progress Notes (Signed)
  Echocardiogram 2D Echocardiogram has been performed.  Jillian Weber 03/18/2015, 12:26 PM

## 2015-03-18 NOTE — Progress Notes (Signed)
CM made referral for home BIPAP with [facility]. [facility] informed pt and CM, pt would have to pay $600.00 per month until sleep study appointment or $ 245.00 if pt takes mask and tubing she's using in the hospital home @ d/c.Pt stated she can't afford cost to [facility] liasion(Stephanie) and CM. CM made MD aware. CM to f/u with d/c needs. Gae Gallop RN,BSN,CM [phone]

## 2015-03-18 NOTE — Progress Notes (Signed)
Placed patient on BIPAP for the night.  Patient is tolerating well at this time. 

## 2015-03-18 NOTE — Progress Notes (Signed)
2nd troponin has come back at 0.65. Pt denies chest pain. Awaiting f/u EKG. I have paged Dr. Tresa Endo with cardiology and am awaiting call back. Pt has already received lovenox at 2200 03/17/15.

## 2015-03-19 DIAGNOSIS — R0902 Hypoxemia: Secondary | ICD-10-CM

## 2015-03-19 DIAGNOSIS — E8770 Fluid overload, unspecified: Secondary | ICD-10-CM

## 2015-03-19 LAB — BASIC METABOLIC PANEL
Anion gap: 8 (ref 5–15)
BUN: 10 mg/dL (ref 6–20)
CALCIUM: 8.9 mg/dL (ref 8.9–10.3)
CHLORIDE: 97 mmol/L — AB (ref 101–111)
CO2: 36 mmol/L — AB (ref 22–32)
CREATININE: 0.68 mg/dL (ref 0.44–1.00)
GFR calc non Af Amer: >60 mL/min (ref >60)
Glucose, Bld: 104 mg/dL — ABNORMAL HIGH (ref 65–99)
Potassium: 4 mmol/L (ref 3.5–5.1)
SODIUM: 141 mmol/L (ref 135–145)

## 2015-03-19 LAB — HEMOGLOBIN A1C
Hgb A1c MFr Bld: 6.3 % — ABNORMAL HIGH (ref 4.8–5.6)
Mean Plasma Glucose: 134 mg/dL

## 2015-03-19 NOTE — Progress Notes (Signed)
Pt is on her BiPAP at this time tolerating it well.

## 2015-03-19 NOTE — Evaluation (Signed)
Physical Therapy Evaluation Patient Details Name: Jillian Weber MRN: [id number] DOB: [birth year] Today's Date: 03/19/2015   History of Present Illness  Pt is a 40 y.o. female admitted with dx of hypoxia. Pt presented to the ED with c/o worsening exertional dyspnea and rapid excessive weight gain. PMH consists of morbid obesit and migraines.   Clinical Impression  Pt admitted with above diagnosis. Pt currently with functional limitations due to the deficits listed below (see PT Problem List). On eval, pt demo mod independence with transfers. Supervision provided for safety during in room ambulation. Pt demo steady gait, but significant decrease in O2 sats with removal of [place]. Pt reports she removes the O2 [place] to go to/from bathroom. Pt is agreeable to attempt hallway ambulation at next session after more fluid has been removed from her system to assess her ability to ambulate longer distances. Pt will benefit from skilled PT to increase their independence and safety with mobility to allow discharge to the venue listed below.       Follow Up Recommendations No PT follow up;Supervision - Intermittent    Equipment Recommendations  None recommended by PT    Recommendations for Other Services       Precautions / Restrictions Precautions Precautions: Other (comment) Precaution Comments: watch O2 sats      Mobility  Bed Mobility               General bed mobility comments: Pt up in recliner upon arrival.  Transfers Overall transfer level: Modified independent Equipment used: None                Ambulation/Gait Ambulation/Gait assistance: Supervision Ambulation Distance (Feet): 30 Feet Assistive device: None Gait Pattern/deviations: Wide base of support;Step-through pattern;Decreased stride length Gait velocity: decreased   General Gait Details: Pt ambulated in room on RA to/from bathroom. Pt quickly desats with removal of O2 [place]. Sat at 75% upon return to recliner  and returned to 91% after replacing Iron.  Stairs            Wheelchair Mobility    Modified Rankin (Stroke Patients Only)       Balance                                             Pertinent Vitals/Pain Pain Assessment: No/denies pain    Home Living Family/patient expects to be discharged to:: Private residence Living Arrangements: Children Available Help at Discharge: Family;Available 24 hours/day Type of Home: House Home Access: Stairs to enter Entrance Stairs-Rails: Doctor, general practice of Steps: 6 Home Layout: One level Home Equipment: None      Prior Function Level of Independence: Independent               Hand Dominance        Extremity/Trunk Assessment                         Communication   Communication: No difficulties  Cognition Arousal/Alertness: Awake/alert Behavior During Therapy: WFL for tasks assessed/performed Overall Cognitive Status: Within Functional Limits for tasks assessed                      General Comments      Exercises        Assessment/Plan    PT Assessment Patient needs continued PT services  PT Diagnosis Difficulty walking;Generalized weakness   PT Problem List Decreased strength;Decreased activity tolerance;Decreased mobility;Cardiopulmonary status limiting activity  PT Treatment Interventions Gait training;Stair training;Functional mobility training;Therapeutic activities;Therapeutic exercise;Patient/family education   PT Goals (Current goals can be found in the Care Plan section) Acute Rehab PT Goals Patient Stated Goal: home PT Goal Formulation: With patient Time For Goal Achievement: 04/02/15 Potential to Achieve Goals: Good    Frequency Min 3X/week   Barriers to discharge        Co-evaluation               End of Session Equipment Utilized During Treatment: Oxygen Activity Tolerance: Treatment limited secondary to medical complications  (Comment) (O2 sats) Patient left: in chair;with call bell/phone within reach Nurse Communication: Mobility status         Time: 1610-9604 PT Time Calculation (min) (ACUTE ONLY): 14 min   Charges:   PT Evaluation $PT Eval Low Complexity: 1 Procedure     PT G Codes:        Ilda Foil 03/19/2015, 12:12 PM

## 2015-03-19 NOTE — Progress Notes (Signed)
PATIENT DETAILS Name: Jillian Weber Age: 40 y.o. Sex: female Date of Birth: Nov 02, 1975 Admit Date: 03/17/2015 Admitting Physician Dewayne Shorter Levora Dredge, MD PCP:No PCP Per Patient  Brief narrative: 40 year old female with morbid obesity (590 pounds on admission) admitted with worsening exertional dyspnea and lower extremity edema. Suspected to have OSA/OHS and an decompensated acute diastolic heart failure. Improving with IV Lasix. See below for details  Subjective: Shortness of breath is improving.  Assessment/Plan: Active Problems: Acute on chronic hypoxic/hypercarbic respiratory failure: Improving. secondary to OHS/OSA with acute diastolic heart failure. Continue BiPAP, IV Lasix. Lower extremity Dopplers negative, d-dimer only minimally elevated-doubt VTE-doubt further workup required at this point. Suspect will require oxygen and BiPAP on discharge.  Acute diastolic heart failure: Echo shows grade 1 diastolic dysfunction, surprisingly RV function is preserved. Clinically has disproportionate amount of edema compared to echo findings-still suspect some amount of RV failure given morbid obesity. Continue IV Lasix 60 mg twice a day. Negative balance of 6.4 L, weight decreased to 575 pounds (590 pounds on admission). Cardiology following  Elevated Troponin:trend not consistent with ACS, suspect demand ischemia from CHF/OHS/OSA. Echo shows preserved ejection fraction without any wall motion abnormality. Plan to manage medically  Morbid obesity: Counseled regarding importance of weight loss. Bariatric surgery referral as outpatient.   Disposition: Remain inpatient  Antimicrobial agents  See below  Anti-infectives    None      DVT Prophylaxis: Prophylactic Lovenox  Code Status: Full code   Family Communication None at bedside  Procedures: None  CONSULTS:  cardiology and pulmonary/intensive care  Time spent 25 minutes-Greater than 50% of this time  was spent in counseling, explanation of diagnosis, planning of further management, and coordination of care.  MEDICATIONS: Scheduled Meds: . aspirin EC  325 mg Oral Daily  . enoxaparin (LOVENOX) injection  130 mg Subcutaneous Q24H  . fluticasone  2 spray Each Nare Daily  . furosemide  60 mg Intravenous Q12H  . loratadine  10 mg Oral Daily  . nicotine  14 mg Transdermal Daily  . oxymetazoline  1 spray Each Nare BID  . potassium chloride  20 mEq Oral BID  . sodium chloride  3 mL Intravenous Q12H  . sodium chloride  3 mL Intravenous Q12H   Continuous Infusions:  PRN Meds:.sodium chloride, acetaminophen **OR** acetaminophen, albuterol, guaiFENesin-dextromethorphan, ondansetron **OR** ondansetron (ZOFRAN) IV, sodium chloride, sodium chloride    PHYSICAL EXAM: Vital signs in last 24 hours: Filed Vitals:   03/18/15 2328 03/19/15 0410 03/19/15 0417 03/19/15 1402  BP:   121/70 127/63  Pulse: 97  88 87  Temp:   97.8 F (36.6 C) 98 F (36.7 C)  TempSrc:   Oral Oral  Resp: Height:      Weight:  261.045 kg (575 lb 8 oz)    SpO2: 94%  92% 100%    Weight change: 25.9 kg (57 lb 1.6 oz) Filed Weights   03/17/15 1954 03/18/15 0826 03/19/15 0410  Weight: 212.465 kg (468 lb 6.4 oz) 264.039 kg (582 lb 1.6 oz) 261.045 kg (575 lb 8 oz)   Body mass index is 100.33 kg/(m^2).   Gen Exam: Awake and alert with clear speech.  Neck: Supple, No JVD.   Chest: B/L Clear-but very distant CVS: S1 S2 Regular, no murmurs.  Abdomen: Obese abdomen-soft, BS +, non tender, non distended.  Extremities: ++ edema, lower extremities warm to touch. Neurologic:  Non Focal.   Skin: No Rash.   Wounds: N/A.    Intake/Output from previous day:  Intake/Output Summary (Last 24 hours) at 03/19/15 1444 Last data filed at 03/19/15 1410  Gross per 24 hour  Intake    360 ml  Output   3150 ml  Net  -2790 ml     LAB RESULTS: CBC  Recent Labs Lab 03/17/15 1523 03/17/15 2118 03/18/15 0037  WBC  7.3 10.5 11.1*  HGB 12.3 12.1 12.4  HCT 41.2 39.9 39.9  PLT 353 353 384  MCV 104.8* 103.6* 102.0*  MCH 31.3 31.4 31.7  MCHC 29.9* 30.3 31.1  RDW 15.2 15.1 14.9  LYMPHSABS 2.0  --   --   MONOABS 0.5  --   --   EOSABS 0.2  --   --   BASOSABS 0.1  --   --     Chemistries   Recent Labs Lab 03/17/15 1523 03/17/15 2118 03/18/15 0037 03/18/15 1130 03/19/15 0517  NA 139  --  139  --  141  K 4.6  --  4.4  --  4.0  CL 104  --  98*  --  97*  CO2 25  --  29  --  36*  GLUCOSE 111*  --  173*  --  104*  BUN 8  --  7  --  10  CREATININE 0.61 0.70 0.67  --  0.68  CALCIUM 9.0  --  9.3  --  8.9  MG  --   --   --  2.0  --     CBG: No results for input(s): GLUCAP in the last 168 hours.  GFR Estimated Creatinine Clearance: 203.6 mL/min (by C-G formula based on Cr of 0.68).  Coagulation profile No results for input(s): INR, PROTIME in the last 168 hours.  Cardiac Enzymes  Recent Labs Lab 03/17/15 2118 03/18/15 0037 03/18/15 0724  TROPONINI 0.05* 0.03 0.06*    Invalid input(s): POCBNP  Recent Labs  03/17/15 1523  DDIMER 0.53*    Recent Labs  03/17/15 2118  HGBA1C 6.3*    Recent Labs  03/18/15 0037  CHOL 152  HDL 44  LDLCALC 99  TRIG 43  CHOLHDL 3.5   No results for input(s): TSH, T4TOTAL, T3FREE, THYROIDAB in the last 72 hours.  Invalid input(s): FREET3 No results for input(s): VITAMINB12, FOLATE, FERRITIN, TIBC, IRON, RETICCTPCT in the last 72 hours. No results for input(s): LIPASE, AMYLASE in the last 72 hours.  Urine Studies No results for input(s): UHGB, CRYS in the last 72 hours.  Invalid input(s): UACOL, UAPR, USPG, UPH, UTP, UGL, UKET, UBIL, UNIT, UROB, ULEU, UEPI, UWBC, URBC, UBAC, CAST, UCOM, BILUA  MICROBIOLOGY: No results found for this or any previous visit (from the past 240 hour(s)).  RADIOLOGY STUDIES/RESULTS: Dg Chest 2 View  03/17/2015  CLINICAL DATA:  Shortness of breath, dry cough. EXAM: CHEST  2 VIEW COMPARISON:  January 05, 2015. FINDINGS: Stable cardiomediastinal silhouette. Hypoinflation of the lungs are noted with probable mild right basilar subsegmental atelectasis. No pneumothorax or definite pleural effusion is noted. Overall, this exam is limited due to body habitus. Bony thorax is unremarkable. IMPRESSION: Hypoinflation of the lungs with probable mild right basilar subsegmental atelectasis. Electronically Signed   By: Lupita Raider, M.D.   On: 03/17/2015 15:51    Jeoffrey Massed, MD  [facility] Pager:[phone]  If 7PM-7AM, please contact night-coverage [email or web address] Password [email or web address] 03/19/2015, 2:44 PM   LOS: 2 days

## 2015-03-19 NOTE — Progress Notes (Signed)
SUBJECTIVE: The patient is doing well today.  At this time, she denies chest pain, shortness of breath, or any new concerns.  Says that she feels improved with diuresis.  She is -6.3L.  [name] aspirin EC  325 mg Oral Daily  . enoxaparin (LOVENOX) injection  130 mg Subcutaneous Q24H  . fluticasone  2 spray Each Nare Daily  . furosemide  60 mg Intravenous Q12H  . loratadine  10 mg Oral Daily  . nicotine  14 mg Transdermal Daily  . oxymetazoline  1 spray Each Nare BID  . potassium chloride  20 mEq Oral BID  . sodium chloride  3 mL Intravenous Q12H  . sodium chloride  3 mL Intravenous Q12H      OBJECTIVE: Physical Exam: Filed Vitals:   03/18/15 2300 03/18/15 2328 03/19/15 0410 03/19/15 0417  BP: 108/64   121/70  Pulse: 93 97  88  Temp: 97.9 F (36.6 C)   97.8 F (36.6 C)  TempSrc: Oral   Oral  Resp: Height:      Weight:   575 lb 8 oz (261.045 kg)   SpO2: 98% 94%  92%    Intake/Output Summary (Last 24 hours) at 03/19/15 1257 Last data filed at 03/19/15 1118  Gross per 24 hour  Intake    360 ml  Output   2850 ml  Net  -2490 ml    Telemetry reveals sinus rhythm with artifact that resembles wide complex tachycardia  GEN- The patient is well appearing, alert and oriented x 3 today.   Head- normocephalic, atraumatic Eyes-  Sclera clear, conjunctiva pink Ears- hearing intact Oropharynx- clear Neck- supple, no JVP Lymph- no cervical lymphadenopathy Lungs- Clear to ausculation bilaterally, normal work of breathing Heart- Regular rate and rhythm, no murmurs, rubs or gallops, PMI not laterally displaced GI- soft, NT, ND, + BS Extremities- no clubbing, cyanosis, or edema Skin- no rash or lesion Psych- euthymic mood, full affect Neuro- strength and sensation are intact  LABS: Basic Metabolic Panel:  Recent Labs  86/57/84 0037 03/18/15 1130 03/19/15 0517  NA 139  --  141  K 4.4  --  4.0  CL 98*  --  97*  CO2 29  --  36*  GLUCOSE 173*  --  104*  BUN 7  --   10  CREATININE 0.67  --  0.68  CALCIUM 9.3  --  8.9  MG  --  2.0  --    Liver Function Tests:  Recent Labs  03/18/15 0037  AST 26  ALT 24  ALKPHOS 88  BILITOT 0.6  PROT 7.6  ALBUMIN 3.2*   No results for input(s): LIPASE, AMYLASE in the last 72 hours. CBC:  Recent Labs  03/17/15 1523 03/17/15 2118 03/18/15 0037  WBC 7.3 10.5 11.1*  NEUTROABS 4.5  --   --   HGB 12.3 12.1 12.4  HCT 41.2 39.9 39.9  MCV 104.8* 103.6* 102.0*  PLT 353 353 384   Cardiac Enzymes:  Recent Labs  03/17/15 2118 03/18/15 0037 03/18/15 0724  TROPONINI 0.05* 0.03 0.06*   BNP: Invalid input(s): POCBNP D-Dimer:  Recent Labs  03/17/15 1523  DDIMER 0.53*   Hemoglobin A1C:  Recent Labs  03/17/15 2118  HGBA1C 6.3*   Fasting Lipid Panel:  Recent Labs  03/18/15 0037  CHOL 152  HDL 44  LDLCALC 99  TRIG 43  CHOLHDL 3.5   Thyroid Function Tests: No results for input(s): TSH, T4TOTAL, T3FREE, THYROIDAB in  the last 72 hours.  Invalid input(s): FREET3 Anemia Panel: No results for input(s): VITAMINB12, FOLATE, FERRITIN, TIBC, IRON, RETICCTPCT in the last 72 hours.  RADIOLOGY: Dg Chest 2 View  03/17/2015  CLINICAL DATA:  Shortness of breath, dry cough. EXAM: CHEST  2 VIEW COMPARISON:  January 05, 2015. FINDINGS: Stable cardiomediastinal silhouette. Hypoinflation of the lungs are noted with probable mild right basilar subsegmental atelectasis. No pneumothorax or definite pleural effusion is noted. Overall, this exam is limited due to body habitus. Bony thorax is unremarkable. IMPRESSION: Hypoinflation of the lungs with probable mild right basilar subsegmental atelectasis. Electronically Signed   By: Lupita Raider, M.D.   On: 03/17/2015 15:51    ASSESSMENT AND PLAN:  Active Problems:   Morbid obesity (HCC)   Dyspnea   Arthritis   Hypoxia   Acute on chronic respiratory failure with hypercapnia (HCC)   Elevated troponin   OSA (obstructive sleep apnea)   Volume overload    Tobacco abuse  Doing better with diuresis, down 6.3L.  Would continue diuresis for now.  Troponin elevation likely due to demand.  EF normal on TTE with grade I diastolic dysfunction likely due to obesity.  Should monitor BP and treat if elevated due to diastolic dysfunction.  Metha Kolasa Jorja Loa, MD 03/19/2015 12:57 PM

## 2015-03-20 LAB — BASIC METABOLIC PANEL
Anion gap: 9 (ref 5–15)
BUN: 9 mg/dL (ref 6–20)
CALCIUM: 9.1 mg/dL (ref 8.9–10.3)
CO2: 36 mmol/L — AB (ref 22–32)
CREATININE: 0.57 mg/dL (ref 0.44–1.00)
Chloride: 96 mmol/L — ABNORMAL LOW (ref 101–111)
GFR calc non Af Amer: >60 mL/min (ref >60)
GLUCOSE: 104 mg/dL — AB (ref 65–99)
Potassium: 4.2 mmol/L (ref 3.5–5.1)
Sodium: 141 mmol/L (ref 135–145)

## 2015-03-20 MED ORDER — METOLAZONE 2.5 MG PO TABS
2.5000 mg | ORAL_TABLET | Freq: Once | ORAL | Status: AC
  Administered 2015-03-20: 2.5 mg via ORAL
  Filled 2015-03-20: qty 1

## 2015-03-20 NOTE — Progress Notes (Signed)
PATIENT DETAILS Name: Jillian Weber Age: 40 y.o. Sex: female Date of Birth: [birth year] Admit Date: 03/17/2015 Admitting Physician Dewayne Shorter Levora Dredge, MD PCP:No PCP Per Patient  Brief narrative:  40 year old female with morbid obesity (590 pounds on admission) admitted with worsening exertional dyspnea and lower extremity edema. Suspected to have OSA/OHS and an decompensated acute diastolic heart failure. Improving with IV Lasix. See below for details  Subjective:  Patient in bed, no fever or chills, no headache, no chest abdominal pain, no focal weakness. Shortness of breath considerably improved.  Assessment/Plan:  Acute on chronic hypoxic/hypercarbic respiratory failure: Improving. secondary to OHS/OSA with acute diastolic heart failure. Continue BiPAP, IV Lasix. Lower extremity Dopplers negative, d-dimer only minimally elevated-doubt VTE-doubt further workup required at this point. Suspect will require oxygen and BiPAP on discharge.  Acute diastolic heart failure: Echo shows grade 1 diastolic dysfunction, surprisingly RV function is preserved. Clinically has disproportionate amount of edema compared to echo findings-still suspect some amount of RV failure given morbid obesity. Continue IV Lasix 60 mg twice a day. Negative balance of 8 L, weight decreased to 575 pounds (590 pounds on admission). Cardiology following  Elevated Troponin: trend not consistent with ACS, suspect demand ischemia from CHF/OHS/OSA. Echo shows preserved ejection fraction without any wall motion abnormality. Plan to manage medically  Morbid obesity: Counseled regarding importance of weight loss. Bariatric surgery referral as outpatient.    Disposition: Remain inpatient  Antimicrobial agents  See below  Anti-infectives    None       DVT Prophylaxis: Prophylactic Lovenox  Code Status: Full code   Family Communication None at bedside  Procedures: None  CONSULTS:  cardiology and pulmonary/intensive care  Time spent 25 minutes-Greater than 50% of this time was spent in counseling, explanation of diagnosis, planning of further management, and coordination of care.  MEDICATIONS: Scheduled Meds: . aspirin EC  325 mg Oral Daily  . enoxaparin (LOVENOX) injection  130 mg Subcutaneous Q24H  . fluticasone  2 spray Each Nare Daily  . furosemide  60 mg Intravenous Q12H  . loratadine  10 mg Oral Daily  . metolazone  2.5 mg Oral Once  . nicotine  14 mg Transdermal Daily  . potassium chloride  20 mEq Oral BID   Continuous Infusions:  PRN Meds:.acetaminophen **OR** [DISCONTINUED] acetaminophen, albuterol, guaiFENesin-dextromethorphan, ondansetron **OR** ondansetron (ZOFRAN) IV, sodium chloride    PHYSICAL EXAM: Vital signs in last 24 hours: Filed Vitals:   03/19/15 1402 03/19/15 2143 03/19/15 2356 03/20/15 0553  BP: 127/63 117/63  119/65  Pulse: 87 93 94 85  Temp: 98 F (36.7 C) 98.1 F (36.7 C)  97.8 F (36.6 C)  TempSrc: Oral Oral  Oral  Resp: Height:      Weight:    261.045 kg (575 lb 8 oz)  SpO2: 100% 99% 98% 91%    Weight change: -2.994 kg (-6 lb 9.6 oz) Filed Weights   03/18/15 0826 03/19/15 0410 03/20/15 0553  Weight: 264.039 kg (582 lb 1.6 oz) 261.045 kg (575 lb 8 oz) 261.045 kg (575 lb 8 oz)   Body mass index is 100.33 kg/(m^2).   Gen Exam: Awake and alert with clear speech.  Neck: Supple, No JVD.   Chest: B/L Clear-but very distant CVS: S1 S2 Regular, no murmurs.  Abdomen: Obese abdomen-soft, BS +, non tender, non distended.  Extremities: ++ edema, lower extremities warm to touch. Neurologic:  Non Focal.   Skin: No Rash.   Wounds: N/A.    Intake/Output from previous day:  Intake/Output Summary (Last 24 hours) at 03/20/15 1052 Last data filed at 03/20/15 0705  Gross per 24 hour  Intake    784 ml  Output   2300 ml  Net  -1516 ml     LAB RESULTS: CBC  Recent Labs Lab 03/17/15 1523 03/17/15 2118  03/18/15 0037  WBC 7.3 10.5 11.1*  HGB 12.3 12.1 12.4  HCT 41.2 39.9 39.9  PLT 353 353 384  MCV 104.8* 103.6* 102.0*  MCH 31.3 31.4 31.7  MCHC 29.9* 30.3 31.1  RDW 15.2 15.1 14.9  LYMPHSABS 2.0  --   --   MONOABS 0.5  --   --   EOSABS 0.2  --   --   BASOSABS 0.1  --   --     Chemistries   Recent Labs Lab 03/17/15 1523 03/17/15 2118 03/18/15 0037 03/18/15 1130 03/19/15 0517 03/20/15 0800  NA 139  --  139  --  141 141  K 4.6  --  4.4  --  4.0 4.2  CL 104  --  98*  --  97* 96*  CO2 25  --  29  --  36* 36*  GLUCOSE 111*  --  173*  --  104* 104*  BUN 8  --  7  --  10 9  CREATININE 0.61 0.70 0.67  --  0.68 0.57  CALCIUM 9.0  --  9.3  --  8.9 9.1  MG  --   --   --  2.0  --   --     CBG: No results for input(s): GLUCAP in the last 168 hours.  GFR Estimated Creatinine Clearance: 203.6 mL/min (by C-G formula based on Cr of 0.57).  Coagulation profile No results for input(s): INR, PROTIME in the last 168 hours.  Cardiac Enzymes  Recent Labs Lab 03/17/15 2118 03/18/15 0037 03/18/15 0724  TROPONINI 0.05* 0.03 0.06*    Invalid input(s): POCBNP  Recent Labs  03/17/15 1523  DDIMER 0.53*    Recent Labs  03/17/15 2118  HGBA1C 6.3*    Recent Labs  03/18/15 0037  CHOL 152  HDL 44  LDLCALC 99  TRIG 43  CHOLHDL 3.5   No results for input(s): TSH, T4TOTAL, T3FREE, THYROIDAB in the last 72 hours.  Invalid input(s): FREET3 No results for input(s): VITAMINB12, FOLATE, FERRITIN, TIBC, IRON, RETICCTPCT in the last 72 hours. No results for input(s): LIPASE, AMYLASE in the last 72 hours.  Urine Studies No results for input(s): UHGB, CRYS in the last 72 hours.  Invalid input(s): UACOL, UAPR, USPG, UPH, UTP, UGL, UKET, UBIL, UNIT, UROB, ULEU, UEPI, UWBC, URBC, UBAC, CAST, UCOM, BILUA  MICROBIOLOGY: No results found for this or any previous visit (from the past 240 hour(s)).  RADIOLOGY STUDIES/RESULTS: Dg Chest 2 View  03/17/2015  CLINICAL DATA:   Shortness of breath, dry cough. EXAM: CHEST  2 VIEW COMPARISON:  January 05, 2015. FINDINGS: Stable cardiomediastinal silhouette. Hypoinflation of the lungs are noted with probable mild right basilar subsegmental atelectasis. No pneumothorax or definite pleural effusion is noted. Overall, this exam is limited due to body habitus. Bony thorax is unremarkable. IMPRESSION: Hypoinflation of the lungs with probable mild right basilar subsegmental atelectasis. Electronically Signed   By: Lupita Raider, M.D.   On: 03/17/2015 15:51    Leroy Sea, MD  [facility] Pager:[phone]  If 7PM-7AM, please contact night-coverage  [email or web address] Password [email or web address] 03/20/2015, 10:52 AM   LOS: 3 days

## 2015-03-21 DIAGNOSIS — I5031 Acute diastolic (congestive) heart failure: Principal | ICD-10-CM

## 2015-03-21 DIAGNOSIS — E662 Morbid (severe) obesity with alveolar hypoventilation: Secondary | ICD-10-CM | POA: Insufficient documentation

## 2015-03-21 DIAGNOSIS — G4733 Obstructive sleep apnea (adult) (pediatric): Secondary | ICD-10-CM

## 2015-03-21 LAB — BASIC METABOLIC PANEL WITH GFR
Anion gap: 11 (ref 5–15)
BUN: 9 mg/dL (ref 6–20)
CO2: 34 mmol/L — ABNORMAL HIGH (ref 22–32)
Calcium: 9.9 mg/dL (ref 8.9–10.3)
Chloride: 93 mmol/L — ABNORMAL LOW (ref 101–111)
Creatinine, Ser: 0.64 mg/dL (ref 0.44–1.00)
GFR calc Af Amer: >60 mL/min
GFR calc non Af Amer: >60 mL/min
Glucose, Bld: 118 mg/dL — ABNORMAL HIGH (ref 65–99)
Potassium: 4.1 mmol/L (ref 3.5–5.1)
Sodium: 138 mmol/L (ref 135–145)

## 2015-03-21 LAB — CBC
HCT: 44.1 % (ref 36.0–46.0)
Hemoglobin: 14 g/dL (ref 12.0–15.0)
MCH: 32 pg (ref 26.0–34.0)
MCHC: 31.7 g/dL (ref 30.0–36.0)
MCV: 100.9 fL — ABNORMAL HIGH (ref 78.0–100.0)
Platelets: 391 10*3/uL (ref 150–400)
RBC: 4.37 MIL/uL (ref 3.87–5.11)
RDW: 14.5 % (ref 11.5–15.5)
WBC: 9.3 10*3/uL (ref 4.0–10.5)

## 2015-03-21 LAB — TSH: TSH: 2.615 u[IU]/mL (ref 0.350–4.500)

## 2015-03-21 LAB — MAGNESIUM: MAGNESIUM: 1.9 mg/dL (ref 1.7–2.4)

## 2015-03-21 MED ORDER — FUROSEMIDE 40 MG PO TABS
40.0000 mg | ORAL_TABLET | Freq: Two times a day (BID) | ORAL | Status: DC
  Administered 2015-03-21 – 2015-03-23 (×5): 40 mg via ORAL
  Filled 2015-03-21 (×5): qty 1

## 2015-03-21 NOTE — Progress Notes (Signed)
Patient Name: Jillian Weber Date of Encounter: 03/21/2015  Active Problems:   Acute diastolic CHF (congestive heart failure), NYHA class 4 (HCC)   Morbid obesity (HCC)   Dyspnea   Arthritis   Hypoxia   Acute on chronic respiratory failure with hypercapnia (HCC)   Elevated troponin   OSA (obstructive sleep apnea)   Tobacco abuse   Primary Cardiologist: Dr Mayford Knife  Patient Profile: 40 yo female w/ hx obesity, migraines, admitted 01/19 w/ SOB>>D-CHF, admit wt 590 lbs. EF 60-65% w/ grade 1 dd.  SUBJECTIVE: Breathing better, no scales at home. O2 sats still dropped w/ ambulation  OBJECTIVE Filed Vitals:   03/20/15 1300 03/20/15 2137 03/21/15 0500 03/21/15 0937  BP: 116/62 130/77 126/68   Pulse: 86 93 84   Temp: 97.6 F (36.4 C) 98.1 F (36.7 C) 97.6 F (36.4 C)   TempSrc: Oral Oral Oral   Resp: Height:      Weight:   575 lb 13.5 oz (261.2 kg) 549 lb (249.025 kg)  SpO2: 98% 93% 96%     Intake/Output Summary (Last 24 hours) at 03/21/15 0952 Last data filed at 03/20/15 2300  Gross per 24 hour  Intake    840 ml  Output   1000 ml  Net   -160 ml   Filed Weights   03/20/15 0553 03/21/15 0500 03/21/15 0937  Weight: 575 lb 8 oz (261.045 kg) 575 lb 13.5 oz (261.2 kg) 549 lb (249.025 kg)    PHYSICAL EXAM General: Well developed, well nourished, female in no acute distress. Head: Normocephalic, atraumatic.  Neck: Supple without bruits, JVD not seen elevated, difficult to assess 2nd body habitus. Lungs:  Resp regular and unlabored, decreased BS bases, good air exchange. Heart: RRR, S1, S2, no S3, S4, or murmur; no rub. Abdomen: Soft, non-tender, non-distended, BS + x 4.  Extremities: No clubbing, cyanosis, trace edema.  Neuro: Alert and oriented X 3. Moves all extremities spontaneously. Psych: Normal affect.  LABS: Basic Metabolic Panel:  Recent Labs  16/10/96 1130 03/19/15 0517 03/20/15 0800  NA  --  141 141  K  --  4.0 4.2  CL  --  97* 96*   CO2  --  36* 36*  GLUCOSE  --  104* 104*  BUN  --  10 9  CREATININE  --  0.68 0.57  CALCIUM  --  8.9 9.1  MG 2.0  --   --    TROPONIN I  Date Value Ref Range Status  03/18/2015 0.06* <0.031 ng/mL Final  03/18/2015 0.03 <0.031 ng/mL Corrected  03/17/2015 0.05* <0.031 ng/mL Final    BNP:  B NATRIURETIC PEPTIDE  Date/Time Value Ref Range Status  03/17/2015 03:30 PM 189.9* 0.0 - 100.0 pg/mL Final  01/05/2015 12:07 PM 23.7 0.0 - 100.0 pg/mL Final   Thyroid Function Tests:No results found for: TSH  TELE: SR       Radiology/Studies: Dg Chest 2 View 03/17/2015  CLINICAL DATA:  Shortness of breath, dry cough. EXAM: CHEST  2 VIEW COMPARISON:  January 05, 2015. FINDINGS: Stable cardiomediastinal silhouette. Hypoinflation of the lungs are noted with probable mild right basilar subsegmental atelectasis. No pneumothorax or definite pleural effusion is noted. Overall, this exam is limited due to body habitus. Bony thorax is unremarkable. IMPRESSION: Hypoinflation of the lungs with probable mild right basilar subsegmental atelectasis. Electronically Signed   By: Lupita Raider, M.D.   On: 03/17/2015 15:51  Current Medications:  . aspirin EC  325 mg Oral Daily  . enoxaparin (LOVENOX) injection  130 mg Subcutaneous Q24H  . fluticasone  2 spray Each Nare Daily  . furosemide  60 mg Intravenous Q12H  . loratadine  10 mg Oral Daily  . nicotine  14 mg Transdermal Daily  . potassium chloride  20 mEq Oral BID      ASSESSMENT AND PLAN: Active Problems:   Acute Diastolic CHF, Class 4 - wt down 41 lbs since admission - need daily standing weights - she will need to weigh at home if home scales are possible. - nutrition consult for low Na+ diet - think at/close to dry weight, MD advise on changing to PO Lasix.    Morbid obesity (HCC) - per IM    Dyspnea - improving w/ diuresis    Arthritis - per IM    Hypoxia - per IM    Acute on chronic respiratory failure with hypercapnia  (HCC) - multifactorial - CCM seeing    Elevated troponin - minimal elevation consistent w/ CHF    OSA (obstructive sleep apnea) - per IM/CCM    Volume overload - per IM    Tobacco abuse - per IM, cessation advised  Signed, Barrett, [name] , PA-C 9:52 AM 03/21/2015 As above; patient seen and examined; dyspnea resolved; change lasix to oral (40 BID); will need fu BMET one week. FU with Dr Mayford Knife following DC. Needs referral for weight loss clinic (I think this is a life threatening disease for Jillian Weber). Olga Millers

## 2015-03-21 NOTE — Progress Notes (Signed)
PATIENT DETAILS Name: Jillian Weber Age: 40 y.o. Sex: female Date of Birth: 05-19-1975 Admit Date: 03/17/2015 Admitting Physician Dewayne Shorter Levora Dredge, MD PCP:No PCP Per Patient  Brief narrative:  40 year old female with morbid obesity (590 pounds on admission) admitted with worsening exertional dyspnea and lower extremity edema. Suspected to have OSA/OHS and an decompensated acute diastolic heart failure. Improving with IV Lasix. See below for details  Subjective:  Patient in bed, no fever or chills, no headache, no chest abdominal pain, no focal weakness. Shortness of breath considerably improved.  Assessment/Plan:  Acute on chronic hypoxic/hypercarbic respiratory failure: Improving. secondary to OHS/OSA with acute diastolic heart failure. Continue BiPAP, IV Lasix. Lower extremity Dopplers negative, d-dimer only minimally elevated-doubt VTE-doubt further workup required at this point. Suspect will require oxygen and BiPAP on discharge.  Acute diastolic heart failure: Echo shows grade 1 diastolic dysfunction, surprisingly RV function is preserved. Clinically has disproportionate amount of edema compared to echo findings-still suspect some amount of RV failure given morbid obesity. Continue IV Lasix 60 mg twice a day. Negative balance of 8L, weight decreased to 575 pounds (590 pounds on admission). Cardiology following  Elevated Troponin: trend not consistent with ACS, suspect demand ischemia from CHF/OHS/OSA. Echo shows preserved ejection fraction without any wall motion abnormality. Plan to manage medically  Morbid obesity: Counseled regarding importance of weight loss. Bariatric surgery referral as outpatient.    Disposition: Remain inpatient  Antimicrobial agents  See below  Anti-infectives    None       DVT Prophylaxis: Prophylactic Lovenox  Code Status: Full code   Family Communication None at bedside  Procedures: None  CONSULTS:  cardiology and pulmonary/intensive care  Time spent 25 minutes-Greater than 50% of this time was spent in counseling, explanation of diagnosis, planning of further management, and coordination of care.  MEDICATIONS: Scheduled Meds: . aspirin EC  325 mg Oral Daily  . enoxaparin (LOVENOX) injection  130 mg Subcutaneous Q24H  . fluticasone  2 spray Each Nare Daily  . furosemide  60 mg Intravenous Q12H  . loratadine  10 mg Oral Daily  . nicotine  14 mg Transdermal Daily  . potassium chloride  20 mEq Oral BID   Continuous Infusions:  PRN Meds:.acetaminophen **OR** [DISCONTINUED] acetaminophen, albuterol, guaiFENesin-dextromethorphan, ondansetron **OR** ondansetron (ZOFRAN) IV, sodium chloride    PHYSICAL EXAM: Vital signs in last 24 hours: Filed Vitals:   03/20/15 0553 03/20/15 1300 03/20/15 2137 03/21/15 0500  BP: 119/65 116/62 130/77 126/68  Pulse: 85 86 93 84  Temp: 97.8 F (36.6 C) 97.6 F (36.4 C) 98.1 F (36.7 C) 97.6 F (36.4 C)  TempSrc: Oral Oral Oral Oral  Resp: Height:      Weight: 261.045 kg (575 lb 8 oz)   261.2 kg (575 lb 13.5 oz)  SpO2: 91% 98% 93% 96%    Weight change: 0.155 kg (5.5 oz) Filed Weights   03/19/15 0410 03/20/15 0553 03/21/15 0500  Weight: 261.045 kg (575 lb 8 oz) 261.045 kg (575 lb 8 oz) 261.2 kg (575 lb 13.5 oz)   Body mass index is 100.39 kg/(m^2).   Gen Exam: Awake and alert with clear speech.  Neck: Supple, No JVD.   Chest: B/L Clear-but very distant CVS: S1 S2 Regular, no murmurs.  Abdomen: Obese abdomen-soft, BS +, non tender, non distended.  Extremities: ++ edema, lower extremities warm to touch. Neurologic: Non Focal.  Skin: No Rash.   Wounds: N/A.    Intake/Output from previous day:  Intake/Output Summary (Last 24 hours) at 03/21/15 0907 Last data filed at 03/20/15 2300  Gross per 24 hour  Intake    840 ml  Output   1000 ml  Net   -160 ml     LAB RESULTS: CBC  Recent Labs Lab 03/17/15 1523  03/17/15 2118 03/18/15 0037  WBC 7.3 10.5 11.1*  HGB 12.3 12.1 12.4  HCT 41.2 39.9 39.9  PLT 353 353 384  MCV 104.8* 103.6* 102.0*  MCH 31.3 31.4 31.7  MCHC 29.9* 30.3 31.1  RDW 15.2 15.1 14.9  LYMPHSABS 2.0  --   --   MONOABS 0.5  --   --   EOSABS 0.2  --   --   BASOSABS 0.1  --   --     Chemistries   Recent Labs Lab 03/17/15 1523 03/17/15 2118 03/18/15 0037 03/18/15 1130 03/19/15 0517 03/20/15 0800  NA 139  --  139  --  141 141  K 4.6  --  4.4  --  4.0 4.2  CL 104  --  98*  --  97* 96*  CO2 25  --  29  --  36* 36*  GLUCOSE 111*  --  173*  --  104* 104*  BUN 8  --  7  --  10 9  CREATININE 0.61 0.70 0.67  --  0.68 0.57  CALCIUM 9.0  --  9.3  --  8.9 9.1  MG  --   --   --  2.0  --   --     CBG: No results for input(s): GLUCAP in the last 168 hours.  GFR Estimated Creatinine Clearance: 203.6 mL/min (by C-G formula based on Cr of 0.57).  Coagulation profile No results for input(s): INR, PROTIME in the last 168 hours.  Cardiac Enzymes  Recent Labs Lab 03/17/15 2118 03/18/15 0037 03/18/15 0724  TROPONINI 0.05* 0.03 0.06*    Invalid input(s): POCBNP No results for input(s): DDIMER in the last 72 hours. No results for input(s): HGBA1C in the last 72 hours. No results for input(s): CHOL, HDL, LDLCALC, TRIG, CHOLHDL, LDLDIRECT in the last 72 hours. No results for input(s): TSH, T4TOTAL, T3FREE, THYROIDAB in the last 72 hours.  Invalid input(s): FREET3 No results for input(s): VITAMINB12, FOLATE, FERRITIN, TIBC, IRON, RETICCTPCT in the last 72 hours. No results for input(s): LIPASE, AMYLASE in the last 72 hours.  Urine Studies No results for input(s): UHGB, CRYS in the last 72 hours.  Invalid input(s): UACOL, UAPR, USPG, UPH, UTP, UGL, UKET, UBIL, UNIT, UROB, ULEU, UEPI, UWBC, URBC, UBAC, CAST, UCOM, BILUA  MICROBIOLOGY: No results found for this or any previous visit (from the past 240 hour(s)).  RADIOLOGY STUDIES/RESULTS: Dg Chest 2  View  03/17/2015  CLINICAL DATA:  Shortness of breath, dry cough. EXAM: CHEST  2 VIEW COMPARISON:  January 05, 2015. FINDINGS: Stable cardiomediastinal silhouette. Hypoinflation of the lungs are noted with probable mild right basilar subsegmental atelectasis. No pneumothorax or definite pleural effusion is noted. Overall, this exam is limited due to body habitus. Bony thorax is unremarkable. IMPRESSION: Hypoinflation of the lungs with probable mild right basilar subsegmental atelectasis. Electronically Signed   By: Lupita Raider, M.D.   On: 03/17/2015 15:51    Leroy Sea, MD  [facility] Pager:[phone]  If 7PM-7AM, please contact night-coverage [email or web address] Password [email or web address] 03/21/2015, 9:07 AM   LOS: 4 days

## 2015-03-21 NOTE — Progress Notes (Signed)
Placed patient on BIPAP for the night.  Patient is tolerating well at this time. 

## 2015-03-21 NOTE — Plan of Care (Signed)
Problem: Food- and Nutrition-Related Knowledge Deficit (NB-1.1) Goal: Nutrition education Formal process to instruct or train a patient/client in a skill or to impart knowledge to help patients/clients voluntarily manage or modify food choices and eating behavior to maintain or improve health. Outcome: Adequate for Discharge Nutrition Education Note  RD consulted for nutrition education regarding low sodium diet.  RD provided "Low Sodium Nutrition Therapy" handout from the Academy of [employer]etics. Reviewed patient's dietary recall. Provided examples on ways to decrease sodium intake in diet. Discouraged intake of processed foods and use of salt shaker. Encouraged fresh fruits and vegetables as well as whole grain sources of carbohydrates to maximize fiber intake.   RD discussed why it is important for patient to adhere to diet recommendations, and emphasized the role of fluids, foods to avoid, and importance of weighing self daily. Teach back method used.  Expect fair compliance.  Body mass index is 95.71 kg/(m^2). Pt meets criteria for extreme obesity, class III based on current BMI.  Current diet order is Heart Healthy with 1500 ml fluid restriction, patient is consuming approximately 100% of meals at this time. Labs and medications reviewed. No further nutrition interventions warranted at this time. RD contact information provided. If additional nutrition issues arise, please re-consult RD.   Cassadi Purdie A. Mayford Knife, RD, LDN, CDE Pager: [phone] After hours Pager: [phone]

## 2015-03-21 NOTE — Progress Notes (Signed)
   Name: Jillian Weber MRN: [id number] DOB: [birth year]    ADMISSION DATE:  03/17/2015 CONSULTATION DATE:  1/20  REFERRING MD : [facility]  CHIEF COMPLAINT:  SOB  SIGNIFICANT EVENTS    STUDIES:  1/20 TTE >> normal LV and RV fxn   HISTORY OF PRESENT ILLNESS:   40 yo mo AAF(582 lbs) with increased SOB over 3 months, + non productive cough, and massive weight gain/ lower ext edema. Family reports classic signs of OSA with apnea, snoring and inability to sleep. [facility] Pulmonary is called for increased dyspnea over 3 months and weight gain. She is a life long smoker.   SUBJECTIVE:   VITAL SIGNS: Temp:  [97.6 F (36.4 C)-98.6 F (37 C)] 98.6 F (37 C) (01/23 1506) Pulse Rate:  [84-95] 95 (01/23 1506) Resp:  [20-22] 22 (01/23 0500) BP: (100-130)/(49-77) 100/49 mmHg (01/23 1506) SpO2:  [93 %-96 %] 93 % (01/23 1506) Weight:  [249.025 kg (549 lb)-261.2 kg (575 lb 13.5 oz)] 249.025 kg (549 lb) (01/23 0937)  PHYSICAL EXAMINATION: General: Massively obese AAF Neuro:  Intact HEENT: No neck Cardiovascular:  HSD Lungs:  CTA Abdomen:  Obese Musculoskeletal:  intact Skin:  Warm, massive edema   Recent Labs Lab 03/19/15 0517 03/20/15 0800 03/21/15 1320  NA 141 141 138  K 4.0 4.2 4.1  CL 97* 96* 93*  CO2 36* 36* 34*  BUN CREATININE 0.68 0.57 0.64  GLUCOSE 104* 104* 118*    Recent Labs Lab 03/17/15 2118 03/18/15 0037 03/21/15 1320  HGB 12.1 12.4 14.0  HCT 39.9 39.9 44.1  WBC 10.5 11.1* 9.3  PLT 353 384 391   No results found.  ASSESSMENT    Hypoxia   Dyspnea   OSA (obstructive sleep apnea)   Morbid obesity (HCC)   Arthritis   Elevated troponin   Volume overload   Tobacco abuse  Discussion: 40 yo mo AAF(582 lbs) with increased SOB over 3 months, + non productive cough, and massive weight gain/ lower ext edema. Family reports classic signs of OSA with apnea, snoring and inability to sleep. [facility] is [facility]. Pulmonary is called for increased dyspnea over 3 months and weight gain. She is a life long smoker. She will need NIMVS for OSA amd evolving OHS. She may be able to qualify for BiPAP at home w dx chronic hypercapneic resp failure and OHS (pCO2 55 on compensated ABG)  PLAN: -O 2 as needed -Nocturnal BiPAP and try to get this for home. Insurance may pay without a PSG given her chronic hypercapnia. She has Medicaid. Will have to check to see if it is possible.  -stop smoking -weight loss -diuresis as she can tolerate  Levy Pupa, MD, PhD 03/21/2015, 4:08 PM [facility] [phone] or if no answer [phone]

## 2015-03-22 ENCOUNTER — Inpatient Hospital Stay (HOSPITAL_COMMUNITY): Payer: Medicaid Other

## 2015-03-22 DIAGNOSIS — Z720 Tobacco use: Secondary | ICD-10-CM

## 2015-03-22 LAB — SPIROMETRY WITH GRAPH
FEF 25-75 Post: 1.73 L/sec
FEF 25-75 Pre: 1.27 L/sec
FEF2575-%CHANGE-POST: 35 %
FEF2575-%PRED-PRE: 45 %
FEF2575-%Pred-Post: 62 %
FEV1-%CHANGE-POST: 10 %
FEV1-%Pred-Post: 61 %
FEV1-%Pred-Pre: 55 %
FEV1-PRE: 1.37 L
FEV1-Post: 1.52 L
FEV1FVC-%CHANGE-POST: 6 %
FEV1FVC-%Pred-Pre: 91 %
FEV6-%CHANGE-POST: 4 %
FEV6-%Pred-Post: 63 %
FEV6-%Pred-Pre: 61 %
FEV6-PRE: 1.78 L
FEV6-Post: 1.86 L
FEV6FVC-%PRED-PRE: 102 %
FEV6FVC-%Pred-Post: 102 %
FVC-%Change-Post: 4 %
FVC-%PRED-POST: 62 %
FVC-%Pred-Pre: 59 %
FVC-POST: 1.86 L
FVC-Pre: 1.78 L
POST FEV1/FVC RATIO: 82 %
PRE FEV6/FVC RATIO: 100 %
Post FEV6/FVC ratio: 100 %
Pre FEV1/FVC ratio: 77 %

## 2015-03-22 MED ORDER — NICOTINE 14 MG/24HR TD PT24: 14.0000 mg | MEDICATED_PATCH | Freq: Every day | TRANSDERMAL | Status: DC

## 2015-03-22 MED ORDER — ASPIRIN 325 MG PO TBEC: 325.0000 mg | DELAYED_RELEASE_TABLET | Freq: Every day | ORAL | Status: DC

## 2015-03-22 MED ORDER — FUROSEMIDE 40 MG PO TABS: 40.0000 mg | ORAL_TABLET | Freq: Two times a day (BID) | ORAL | Status: DC

## 2015-03-22 MED ORDER — POTASSIUM CHLORIDE CRYS ER 20 MEQ PO TBCR: 20.0000 meq | EXTENDED_RELEASE_TABLET | Freq: Every day | ORAL | Status: DC

## 2015-03-22 MED ORDER — ALBUTEROL SULFATE (2.5 MG/3ML) 0.083% IN NEBU
2.5000 mg | INHALATION_SOLUTION | Freq: Once | RESPIRATORY_TRACT | Status: AC
  Administered 2015-03-22: 2.5 mg via RESPIRATORY_TRACT

## 2015-03-22 NOTE — Discharge Summary (Signed)
Jillian Weber, is a 40 y.o. female  DOB Jul 28, 1975  MRN [id number].  Admission date:  03/17/2015  Admitting Physician  Maretta Bees, MD  Discharge Date:  03/22/2015   Primary MD  No PCP Per Patient  Recommendations for primary care physician for things to follow:   Monitor weight, BMP, KDur and diuretic dose closely.   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 Obesity   . Bronchitis   . Hypertension   . Arthritis   . Carpal tunnel syndrome   . Migraine     Past Surgical History  Procedure Laterality Date  . Tonsillectomy    . Tonsillectomy    . Tubal ligation    . Wrist surgery      bilateral, carpal tunnel  . Cholecystectomy         HPI  from the history and physical done on the day of admission:   40 year old female with morbid obesity (590 pounds on admission) admitted with worsening exertional dyspnea and lower extremity edema. Suspected to have OSA/OHS and an decompensated acute diastolic heart failure. Improving with IV Lasix. 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 secondary to OHS/OSA with acute diastolic heart failure. Continue BiPAP,  IV Lasix. Lower extremity Dopplers negative, d-dimer only minimally elevated. She will get home oxygen if she qualifies, case management requested to arrange for BiPAP if she gets approved. Outpatient sleep study also scheduled. Was seen by pulmonary here. Her shortness of breath is much improved and she feels close to baseline. Eager to go home.  Acute diastolic heart failure: Echo shows grade 1 diastolic dysfunction, surprisingly RV function is preserved. Clinically has disproportionate amount of edema compared to echo findings-still suspect some amount of RV failure given morbid obesity. Continue IV Lasix 60 mg twice a day. Negative balance of 10.5L, weight decreased to 545 pounds (590 pounds on admission). Etiology followed the patient and switched her to oral Lasix 40 mg twice a day along with potassium supplementation, this will be continued upon discharge, patient has been counseled upon [place] fluid restriction and daily weights. She will follow with PCP and cardiology closely in the outpatient setting.  Elevated Troponin: trend not consistent with ACS, suspect demand ischemia from CHF/OHS/OSA. Echo shows preserved ejection fraction without any wall motion abnormality. Plan to manage medically. Aspirin recommended by cardiology upon discharge.  Morbid  obesity: Counseled regarding importance of weight loss. Bariatric surgery referral as outpatient.      Discharge Condition: Stable  Follow UP  Follow-up Information    Follow up with [facility]. Go on 04/24/2015.   Why:  Appointment scheduled for 04/24/15 at 8pm. Patient is also  placed on wait list should earlier  appointment presents.    Contact information:   [address] [place] [address] [phone]      Follow up with [facility].   Why:  Follow up hospital visit and establish PCP,  appointment arranged for 04/28/15 at 10:30 am with Concepcion Living NP   Contact  information:   [address] [place] [address]       Follow up with Valarie Merino, MD. Schedule an appointment as soon as possible for a visit in 1 week.   Specialty:  General Surgery   Why:  Beriatric Surgery Evaluation   Contact information:   [address] [place] [address] [phone]       Follow up with Olga Millers, MD. Schedule an appointment as soon as possible for a visit in 1 week.   Specialty:  Cardiology   Contact information:   [address] [place] [address] [phone]        Consults obtained - Cards, PCCM  Diet and Activity recommendation: See Discharge Instructions below  Discharge Instructions       Discharge Instructions    Discharge instructions    Complete by:  As directed   Follow with Primary MD in 7 days   Get CBC, CMP, 2 view Chest X ray checked  by Primary MD next visit.    Activity: As tolerated with Full fall precautions use walker/cane & assistance as needed   Disposition Home     Diet:   Heart Healthy   Check your Weight same time everyday, if you gain over 2 pounds, or you develop in leg swelling, experience more shortness of breath or chest pain, call your Primary MD immediately. Follow Cardiac Low Salt Diet and 1.5 lit/day fluid restriction.   On your next visit with your primary care physician please Get Medicines reviewed and adjusted.   Please request your Prim.MD to go over all Hospital Tests and Procedure/Radiological results at the follow up, please get all Hospital records sent to your Prim MD by signing hospital release before you go home.   If you experience worsening of your admission symptoms, develop shortness of breath, life threatening emergency, suicidal or homicidal thoughts you must seek medical attention immediately by calling 911 or calling your MD immediately  if symptoms less severe.  You Must read complete instructions/literature along with all the  possible adverse reactions/side effects for all the Medicines you take and that have been prescribed to you. Take any new Medicines after you have completely understood and accpet all the possible adverse reactions/side effects.   Do not drive, operating heavy machinery, perform activities at heights, swimming or participation in water activities or provide baby sitting services if your were admitted for syncope or siezures until you have seen by Primary MD or a Neurologist and advised to do so again.  Do not drive when taking Pain medications.    Do not take more than prescribed Pain, Sleep and Anxiety Medications  Special Instructions: If you have smoked or chewed Tobacco  in the last 2 yrs please stop  smoking, stop any regular Alcohol  and or any Recreational drug use.  Wear Seat belts while driving.   Please note  You were cared for by a hospitalist during your hospital stay. If you have any questions about your discharge medications or the care you received while you were in the hospital after you are discharged, you can call the unit and asked to speak with the hospitalist on call if the hospitalist that took care of you is not available. Once you are discharged, your primary care physician will handle any further medical issues. Please note that NO REFILLS for any discharge medications will be authorized once you are discharged, as it is imperative that you return to your primary care physician (or establish a relationship with a primary care physician if you do not have one) for your aftercare needs so that they can reassess your need for medications and monitor your lab values.     Increase activity slowly    Complete by:  As directed              Discharge Medications       Medication List    STOP taking these medications        ibuprofen 200 MG tablet  Commonly known as:  ADVIL,MOTRIN     meloxicam 15 MG tablet  Commonly known as:  MOBIC      TAKE these medications         albuterol 108 (90 Base) MCG/ACT inhaler  Commonly known as:  PROVENTIL HFA;VENTOLIN HFA  Inhale 2 puffs into the lungs every 2 (two) hours as needed for wheezing or shortness of breath (cough).     aspirin 325 MG EC tablet  Take 1 tablet (325 mg total) by mouth daily.     furosemide 40 MG tablet  Commonly known as:  LASIX  Take 1 tablet (40 mg total) by mouth 2 (two) times daily.     guaiFENesin 600 MG 12 hr tablet  Commonly known as:  MUCINEX  Take 600 mg by mouth 2 (two) times daily as needed for cough or to loosen phlegm.     ICY HOT MEDICATED SPRAY EX  Apply 1 spray topically daily as needed. For pain     MELATONIN PO  Take 2 mLs by mouth daily as needed. For sleep     nicotine 14 mg/24hr patch  Commonly known as:  NICODERM CQ - dosed in mg/24 hours  Place 1 patch (14 mg total) onto the skin daily.     OVER THE COUNTER MEDICATION  Apply 1 application topically daily as needed. Medication: Asperceam.Apply daily as needed  for pain     potassium chloride SA 20 MEQ tablet  Commonly known as:  K-DUR,KLOR-CON  Take 1 tablet (20 mEq total) by mouth daily.     SUMAtriptan 100 MG tablet  Commonly known as:  IMITREX  Take one at earliest onset of migraine headache. May repeat in 2 hours if headache persist. No more than 2 in 24 hours.     TYLENOL ARTHRITIS EXT RELIEF PO  Take 1 tablet by mouth 2 (two) times daily.     TYLENOL SINUS SEVERE CONGEST PO  Take 30 mLs by mouth daily as needed. For sever cold symptoms        Major procedures and Radiology Reports - PLEASE review detailed and final reports for all details, in brief -   TTE  Left ventricle: The cavity size was normal. Wall thickness was normal.  Systolic function was normal. The estimated ejection fraction was in the range of 60% to 65%. Wall motion was normal; there were no regional wall motion abnormalities. Doppler parameters are consistent with abnormal left ventricular relaxation (grade 1  diastolic dysfunction).   Vas [place]  No obvious evidence of deep vein thrombosis involving the right  lower extremity and left lower extremity. - Technically limited by body habitus.   Dg Chest 2 View  03/17/2015  CLINICAL DATA:  Shortness of breath, dry cough. EXAM: CHEST  2 VIEW COMPARISON:  January 05, 2015. FINDINGS: Stable cardiomediastinal silhouette. Hypoinflation of the lungs are noted with probable mild right basilar subsegmental atelectasis. No pneumothorax or definite pleural effusion is noted. Overall, this exam is limited due to body habitus. Bony thorax is unremarkable. IMPRESSION: Hypoinflation of the lungs with probable mild right basilar subsegmental atelectasis. Electronically Signed   By: Lupita Raider, M.D.   On: 03/17/2015 15:51    Micro Results      No results found for this or any previous visit (from the past 240 hour(s)).     Today   Subjective    Jillian Weber today has no headache,no chest abdominal pain,no new weakness tingling or numbness, feels much better wants to go home today.     Objective   Blood pressure 102/46, pulse 87, temperature 98.2 F (36.8 C), temperature source Oral, resp. rate 20, height 5' 3.5" (1.613 m), weight 245.1 kg (540 lb 5.6 oz), last menstrual period 03/13/2015, SpO2 91 %.   Intake/Output Summary (Last 24 hours) at 03/22/15 1026 Last data filed at 03/22/15 0747  Gross per 24 hour  Intake   1060 ml  Output   3000 ml  Net  -1940 ml    Exam Awake Alert, Oriented x 3, No new F.N deficits, Normal affect [place].AT,PERRAL Supple Neck,No JVD, No cervical lymphadenopathy appriciated.  Symmetrical Chest wall movement, Good air movement bilaterally, CTAB RRR,No Gallops,Rubs or new Murmurs, No Parasternal Heave +ve B.Sounds, Abd Soft, Non tender, No organomegaly appriciated, No rebound -guarding or rigidity. No Cyanosis, Clubbing , 1+ leg edema, No new Rash or bruise   Data Review   CBC w Diff: Lab Results  Component  Value Date   WBC 9.3 03/21/2015   HGB 14.0 03/21/2015   HCT 44.1 03/21/2015   PLT 391 03/21/2015   LYMPHOPCT 27 03/17/2015   MONOPCT 7 03/17/2015   EOSPCT 3 03/17/2015   BASOPCT 1 03/17/2015    CMP: Lab Results  Component Value Date   NA 138 03/21/2015   K 4.1 03/21/2015   CL 93* 03/21/2015   CO2 34* 03/21/2015   BUN 9 03/21/2015   CREATININE 0.64 03/21/2015   CREATININE 0.37* 01/27/2015   PROT 7.6 03/18/2015   ALBUMIN 3.2* 03/18/2015   BILITOT 0.6 03/18/2015   ALKPHOS 88 03/18/2015   AST 26 03/18/2015   ALT 24 03/18/2015  .   Total Time in preparing paper work, data evaluation and todays exam - 35 minutes  Leroy Sea M.D on 03/22/2015 at 10:26 AM  [facility]   Office  [phone]

## 2015-03-22 NOTE — Progress Notes (Addendum)
Pt is for d/c to home with bipap. CM unable to get  DME/bipap for pt 2/2 pt not meeting critera.CM made medical advisor aware and order given to obtain bipap with LOG through [facility] and  CM explained to pt the importance of showing up for scheduled sleep study once d/c. [facility] called for DME/bipap howerver bipap will not be delivered until am 2/2 call made after 5pm. MD advisor made aware CM to f/u in am. Gae Gallop RN,BSN,CM [phone]

## 2015-03-22 NOTE — Progress Notes (Signed)
Heart Failure Navigator Consult Note  Presentation: Jillian Weber presented with is a 40 y.o. female with a Past Medical History of morbid obesity, migraine headaches who presents today with the above noted complaint. Per patient, over the past 3 months or so she's had worsening exertional dyspnea. Initially her dyspnea occurred after walking 20-30 feet, but over the past month or so dyspnea occurs with a few steps. Over the past few days, she gets severe exertional dyspnea on just moving around in the bed. She also claims that approximately 3 months ago she weighed around 505 pounds, but today she weighs here was 590 pounds. She gives a history of progressively worsening lower extremity edema-she claims that it is difficult for her to now lift her weight. She also gives a history of not being able to lie flat because of dyspnea-she claims that she has not been able to sleep-as she wakes up short of breath. Because of rapidly worsening symptoms, however daughter called EMS and she was brought to the hospital for further evaluation and treatment. In the emergency room, she was noted to be hypoxic to the 80s on room air, limited data were unremarkable-I was asked to admit this patient for further evaluation and treatment. She also gives a history of nasal congestion, cough-but denies fever. She does give a history of chest heaviness at times-particularly when she is short of breath. But denies history of active chest pain.   Past Medical History  Diagnosis Date  . Obesity   . Bronchitis   . Hypertension   . Arthritis   . Carpal tunnel syndrome   . Migraine     Social History   Social History  . Marital Status: Married    Spouse Name: N/A  . Number of Children: N/A  . Years of Education: N/A   Social History Main Topics  . Smoking status: Current Every Day Smoker -- 0.50 packs/day for 10 years    Types: Cigarettes  . Smokeless tobacco: Never Used  . Alcohol Use: 0.0 oz/week    0  Standard drinks or equivalent per week     Comment: occ  . Drug Use: No  . Sexual Activity:    Partners: Male    Birth Control/ Protection: Surgical   Other Topics Concern  . None   Social History Narrative    ECHO:Study Conclusions  - Left ventricle: The cavity size was normal. Wall thickness was normal. Systolic function was normal. The estimated ejection fraction was in the range of 60% to 65%. Wall motion was normal; there were no regional wall motion abnormalities. Doppler parameters are consistent with abnormal left ventricular relaxation (grade 1 diastolic dysfunction).  Transthoracic echocardiography. M-mode, complete 2D, spectral Doppler, and color Doppler. Birthdate: Patient birthdate: 30-Jul-1975. Age: Patient is 40 yr old. Sex: Gender: female. BMI: 103.1 kg/m^2. Blood pressure:   147/78 Patient status: Inpatient. Study date: Study date: 03/18/2015. Study time: 11:45 AM. Location: Bedside.  BNP    Component Value Date/Time   BNP 189.9* 03/17/2015 1530    ProBNP    Component Value Date/Time   PROBNP 14.6 07/11/2013 1217     Education Assessment and Provision:  Detailed education and instructions provided on heart failure disease management including the following:  Signs and symptoms of Heart Failure When to call the physician Importance of daily weights Low sodium diet Fluid restriction Medication management Anticipated future follow-up appointments  Patient education given on each of the above topics.  Patient acknowledges understanding and acceptance of  all instructions.  I spoke at length with Jillian Weber regarding her HF.  She tells me that this is "all new".  She lives in [place] with her daughters- 15 yrs, 20 yrs and son.  She does not have a scale and cannot afford one to accommodate her weight therefore I have provided her with a talking scale that goes to 550 lbs.  She weighs 540 lbs today.  I reviewed the importance of  daily weights and how weight gains relate to the signs and symptoms of HF.  I also reinforced signs/symptoms of HF and when for her to contact the physician.  She had already received education from a dietician-- I reviewed a low sodium diet and high sodium foods to avoid.  Her children tell me that she "loves [employer] hotdogs".  I discouraged these types of high sodium foods.  She denies any issues with getting or taking prescribed medications however she was not taking any prescribed daily medications prior to admission. She will follow with [facility].    Education Materials:  "Living Better With Heart Failure" Booklet, Daily Weight Tracker Tool .   High Risk Criteria for Readmission and/or Poor Patient Outcomes:   EF <30%- 60-65% with grade1 dias dys.  2 or more admissions in 6 months- No  Difficult social situation- No  Demonstrates medication noncompliance- No denies -however was not taking any prescribed medications prior to admission.    Barriers of Care:  New HF, Knowledge, and compliance  Discharge Planning:   Plans to discharge to home in [place] with children.  She would benefit from [facility] for ongoing HF education, compliance and symptom recognition.

## 2015-03-22 NOTE — Progress Notes (Signed)
Jory Ee to be D/C'd Home per MD order.  Discussed with the patient and all questions fully answered.  VSS, Skin clean, dry and intact without evidence of skin break down, no evidence of skin tears noted. IV catheter discontinued intact. Site without signs and symptoms of complications. Dressing and pressure applied.  An After Visit Summary was printed and given to the patient. Patient received prescription.  D/c education completed with patient/family including follow up instructions, medication list, d/c activities limitations if indicated, with other d/c instructions as indicated by MD - patient able to verbalize understanding, all questions fully answered.   Patient instructed to return to ED, call 911, or call MD for any changes in condition.   Patient to be escorted via WC, and D/C home via private auto.  Delay in discharge r/t patient's home bipap/cpap that needs to be delivered into room.   L'ESPERANCE, Jovian Lembcke C 03/22/2015 11:39 AM

## 2015-03-22 NOTE — Progress Notes (Signed)
PT Cancellation Note  Patient Details Name: Jillian Weber MRN: [id number] DOB: 07-May-1975   Cancelled Treatment:    Reason Eval/Treat Not Completed: Other (comment). PT treatment cancelled this session as this PTA witnessed patient walking in hallway with nursing staff before attempting to work with patient. Patient was moving very well and RN had no concerns regarding ambulation. Spoke with patient and she stated that she felt well and knows her limits and when she is safe. She had no questions regarding therapy but she was encouraged to let RN know if she had any and that RN could call PT for her. Plan is for patient to DC later today   Fredrich Birks 03/22/2015, 11:04 AM  03/22/2015 Janiesha Diehl, Adline Potter PTA

## 2015-03-22 NOTE — Progress Notes (Signed)
Patient Sp02 96% on room air at rest. Ambulated on room air sp02 92%. Dr. Thedore Mins made aware.

## 2015-03-23 LAB — BLOOD GAS, ARTERIAL
Acid-Base Excess: 6.9 mmol/L — ABNORMAL HIGH (ref 0.0–2.0)
BICARBONATE: 31.5 meq/L — AB (ref 20.0–24.0)
DELIVERY SYSTEMS: POSITIVE
Drawn by: 345601
O2 Content: 2 L/min
O2 Saturation: 97.6 %
Patient temperature: 98.6
TCO2: 33.1 mmol/L (ref 0–100)
pCO2 arterial: 50.7 mmHg — ABNORMAL HIGH (ref 35.0–45.0)
pH, Arterial: 7.411 (ref 7.350–7.450)
pO2, Arterial: 106 mmHg — ABNORMAL HIGH (ref 80.0–100.0)

## 2015-03-23 NOTE — Progress Notes (Signed)
   Name: Jillian Weber MRN: [id number] DOB: 23-Dec-1975    ADMISSION DATE:  03/17/2015 CONSULTATION DATE:  1/20  REFERRING MD : [facility]  CHIEF COMPLAINT:  SOB  SIGNIFICANT EVENTS    STUDIES:  1/20 TTE >> normal LV and RV fxn   HISTORY OF PRESENT ILLNESS:   40 yo mo AAF(582 lbs) with increased SOB over 3 months, + non productive cough, and massive weight gain/ lower ext edema. Family reports classic signs of OSA with apnea, snoring and inability to sleep. [facility] Pulmonary is called for increased dyspnea over 3 months and weight gain. She is a life long smoker.   SUBJECTIVE:   VITAL SIGNS: Temp:  [98.1 F (36.7 C)-98.4 F (36.9 C)] 98.2 F (36.8 C) (01/25 0533) Pulse Rate:  [90-91] 90 (01/25 0533) Resp:  [16-18] 16 (01/25 0533) BP: (107-117)/(67-79) 117/67 mmHg (01/25 0533) SpO2:  [91 %-98 %] 91 % (01/25 0533) Weight:  [537 lb 11.2 oz (243.9 kg)] 537 lb 11.2 oz (243.9 kg) (01/25 0533)  PHYSICAL EXAMINATION: General: Massively obese AAF, NAD at rest on room air Neuro:  Intact HEENT: No neck Cardiovascular:  HSD Lungs:  CTA Abdomen:  Obese Musculoskeletal:  intact Skin:  Warm, decreased edema   Recent Labs Lab 03/19/15 0517 03/20/15 0800 03/21/15 1320  NA 141 141 138  K 4.0 4.2 4.1  CL 97* 96* 93*  CO2 36* 36* 34*  BUN CREATININE 0.68 0.57 0.64  GLUCOSE 104* 104* 118*    Recent Labs Lab 03/17/15 2118 03/18/15 0037 03/21/15 1320  HGB 12.1 12.4 14.0  HCT 39.9 39.9 44.1  WBC 10.5 11.1* 9.3  PLT 353 384 391   No results found.  ASSESSMENT    Hypoxia   Dyspnea   OSA (obstructive sleep apnea)   Morbid obesity (HCC)   Arthritis   Elevated troponin   Volume overload   Tobacco abuse  Discussion: 40 yo mo AAF(582 lbs) with increased SOB over 3 months, + non productive cough, and massive weight gain/ lower ext edema. Family reports classic signs of OSA with apnea, snoring and inability to sleep.  [facility]. Pulmonary is called for increased dyspnea over 3 months and weight gain. She is a life long smoker. She will need NIMVS for OSA amd evolving OHS. She may be able to qualify for BiPAP at home w dx chronic hypercapneic resp failure and OHS (pCO2 55 on compensated ABG)  PLAN: -O 2 as needed -Nocturnal BiPAP qhs. Based on pCO2 55, FEV1/FVC ratio 77 and FEV1 > 50% predicted  She should qualify for home BiPAP. Appreciate Case management's assistance with this.  -stop smoking -weight loss -diuresis as she can tolerate. - dc home and follow up sleep study. She can call PCCM office and ask for Trena Platt [phone] and she can schedule OPT sleep study. This info placed on Dc instructions.  Brett Canales Minor ACNP [facility] PCCM Pager [phone] till 3 pm If no answer page [phone] 03/23/2015, 10:27 AM   Attending Note:  I have examined patient, reviewed labs, studies and notes. I have discussed the case with S Minor, and I agree with the data and plans as amended above.   Levy Pupa, MD, PhD 03/23/2015, 4:36 PM [facility] [phone] or if no answer [phone]

## 2015-03-23 NOTE — Progress Notes (Addendum)
Discharge instructions reviewed again. Patient d/c via wheelchair when CPAP machine delivered to patient in [phone]

## 2015-03-23 NOTE — Discharge Instructions (Signed)
Follow with Primary MD in 7 days   Get CBC, CMP, 2 view Chest X ray checked  by Primary MD next visit.    Activity: As tolerated with Full fall precautions use walker/cane & assistance as needed   Disposition Home     Diet:   Heart Healthy   Check your Weight same time everyday, if you gain over 2 pounds, or you develop in leg swelling, experience more shortness of breath or chest pain, call your Primary MD immediately. Follow Cardiac Low Salt Diet and 1.5 lit/day fluid restriction.   On your next visit with your primary care physician please Get Medicines reviewed and adjusted.   Please request your Prim.MD to go over all Hospital Tests and Procedure/Radiological results at the follow up, please get all Hospital records sent to your Prim MD by signing hospital release before you go home.   If you experience worsening of your admission symptoms, develop shortness of breath, life threatening emergency, suicidal or homicidal thoughts you must seek medical attention immediately by calling 911 or calling your MD immediately  if symptoms less severe.  You Must read complete instructions/literature along with all the possible adverse reactions/side effects for all the Medicines you take and that have been prescribed to you. Take any new Medicines after you have completely understood and accpet all the possible adverse reactions/side effects.   Do not drive, operating heavy machinery, perform activities at heights, swimming or participation in water activities or provide baby sitting services if your were admitted for syncope or siezures until you have seen by Primary MD or a Neurologist and advised to do so again.  Do not drive when taking Pain medications.    Do not take more than prescribed Pain, Sleep and Anxiety Medications  Special Instructions: If you have smoked or chewed Tobacco  in the last 2 yrs please stop smoking, stop any regular Alcohol  and or any Recreational drug  use.  Wear Seat belts while driving.   Please note  You were cared for by a hospitalist during your hospital stay. If you have any questions about your discharge medications or the care you received while you were in the hospital after you are discharged, you can call the unit and asked to speak with the hospitalist on call if the hospitalist that took care of you is not available. Once you are discharged, your primary care physician will handle any further medical issues. Please note that NO REFILLS for any discharge medications will be authorized once you are discharged, as it is imperative that you return to your primary care physician (or establish a relationship with a primary care physician if you do not have one) for your aftercare needs so that they can reassess your need for medications and monitor your lab values.   Call [place] [facility] and ask for Trena Platt [phone] and she can schedule  sleep study.

## 2015-03-24 ENCOUNTER — Telehealth (HOSPITAL_COMMUNITY): Payer: Self-pay | Admitting: Surgery

## 2015-03-24 NOTE — Telephone Encounter (Signed)
Heart Failure Nurse Navigator Post Discharge Telephone Call  I called to check on Jillian Weber after her recent hospitalization.  She tells me that she is out "now" buying batteries for scale that I gave her to check daily weights on.  She says that she is feeling much better and denies SOB.  She has an appt a [facility] on Monday

## 2015-03-24 NOTE — Telephone Encounter (Signed)
Continues from previous note.Marland KitchenMarland KitchenMarland KitchenShe has an appt on Wed Feb 1 at [facility].  She denies any issue with getting or taking prescribed medications.  I have encouraged her to call me back if she has questions or concerns regarding her HF.

## 2015-03-30 ENCOUNTER — Ambulatory Visit: Payer: Medicaid Other | Admitting: Physician Assistant

## 2015-03-30 ENCOUNTER — Encounter: Payer: Self-pay | Admitting: Physician Assistant

## 2015-03-30 ENCOUNTER — Encounter: Payer: Medicaid Other | Admitting: Obstetrics and Gynecology

## 2015-03-31 DIAGNOSIS — I5032 Chronic diastolic (congestive) heart failure: Secondary | ICD-10-CM | POA: Insufficient documentation

## 2015-03-31 NOTE — Progress Notes (Addendum)
Cardiology Office Note Date:  04/01/2015  Patient ID:  Jillian Weber, Jillian Weber 11/30/1975, MRN [id number] PCP:  No PCP Per Patient  Cardiologist:  Dr. Mayford Knife  Chief Complaint: f/u CHF  History of Present Illness: Jillian Weber is a 40 y.o. female with history of morbid obesity with alveolar hypoventilation, arthritis, OSA, tobacco abuse, HTN, migraines presents for post-hospital follow-up.  She was admitted 02/2015 with worsening DOE and LEE. She was diagnosed with acute on chronic hypoxic/hypercarbic repspiratory failure felt due in part to acute diasotlic CHF. 2D Echo 03/18/15: EF 60-65%, no RWMA, grade 1 DD. Labs notable for troponin peak 0.06, TSH WNL, hypercarbia with peak CO2 36, hyperglycemia up to 173 (A1C 6.3), LDL 99. Troponin trend was not c/w ACS; suspected due to demand ischemia/CHF. D-dimer 0.53; LE duplex neg for DVT. CXR showed hypoinflation of lungs with probable mild right basilar subsegmental atx. She weighed 590 on admission and was discharged at 545lb.  Since discharge from the hospital she feels significantly better. Weight is down another 12lbs. SOB has improved. She has very rare fleeting chest tightness. She does not exercise as she says she cannot afford the gym. She loves Pepsi but recently switched to water - she says she was unaware of the recommendation for fluid restriction. She has been trying to pay attention to low sodium diet but admits it has been difficult. She was given a scale at discharge by social worker but reports it is no longer functioning normally. She inquires about whether we would consider prescribing some sort of weight loss pill. Sleep study is scheduled for later this month. It was very challenging to obtain her BP as we had to use the thigh cuff on her upper arm to register a value.   Past Medical History  Diagnosis Date  . Morbid obesity (HCC)   . Hypertension   . Arthritis   . Carpal tunnel syndrome   . Migraine   . Tobacco abuse    . Obesity hypoventilation syndrome (HCC)   . Chronic diastolic CHF (congestive heart failure) [facility])     Past Surgical History  Procedure Laterality Date  . Tonsillectomy      X 2  . Tubal ligation    . Wrist surgery      bilateral, carpal tunnel  . Cholecystectomy      Current Outpatient Prescriptions  Medication Sig Dispense Refill  . Acetaminophen (TYLENOL ARTHRITIS EXT RELIEF PO) Take 1 tablet by mouth 2 (two) times daily.    [name] albuterol (PROVENTIL HFA;VENTOLIN HFA) 108 (90 BASE) MCG/ACT inhaler Inhale 2 puffs into the lungs every 2 (two) hours as needed for wheezing or shortness of breath (cough). 1 Inhaler 0  . aspirin EC 325 MG EC tablet Take 1 tablet (325 mg total) by mouth daily. 30 tablet 0  . furosemide (LASIX) 40 MG tablet Take 1 tablet (40 mg total) by mouth 2 (two) times daily. 60 tablet 0  . MELATONIN PO Take 2 mLs by mouth daily as needed. For sleep    . Menthol, Topical Analgesic, (ICY HOT MEDICATED SPRAY EX) Apply 1 spray topically daily as needed. For pain    . potassium chloride SA (K-DUR,KLOR-CON) 20 MEQ tablet Take 1 tablet (20 mEq total) by mouth daily. 30 tablet 0  . SUMAtriptan (IMITREX) 100 MG tablet Take one at earliest onset of migraine headache. May repeat in 2 hours if headache persist. No more than 2 in 24 hours. (Patient taking differently: Take 100 mg by  mouth every 2 (two) hours as needed. Take one at earliest onset of migraine headache. May repeat in 2 hours if headache persist. No more than 2 in 24 hours.) 10 tablet 0  . [DISCONTINUED] fluticasone (FLONASE) 50 MCG/ACT nasal spray Place 2 sprays into both nostrils daily. (Patient not taking: Reported on 01/05/2015) 16 g 0   No current facility-administered medications for this visit.    Allergies:   Review of patient's allergies indicates no known allergies.   Social History:  The patient  reports that she has been smoking Cigarettes.  She has a 5 pack-year smoking history. She has never used  smokeless tobacco. She reports that she drinks alcohol. She reports that she does not use illicit drugs.   Family History:  The patient's family history includes Asthma in her father and sister; Cancer in her mother; Coronary artery disease in her mother; Diabetes in her father and sister; Hypertension in her father and mother; Osteoarthritis in her mother.   ROS:  Please see the history of present illness.  All other systems are reviewed and otherwise negative.   PHYSICAL EXAM:  VS:  BP 138/82 mmHg  Pulse 92  Ht 5' 3.5" (1.613 m)  Wt 533 lb (241.767 kg)  BMI 92.92 kg/m2  SpO2 93%  LMP 03/13/2015 BMI: Body mass index is 92.92 kg/(m^2). Well nourished, well developed morbidly obese F, in no acute distress HEENT: normocephalic, atraumatic Neck: no JVD, carotid bruits or masses Cardiac:  distant heart sounds but RRR; no murmurs, rubs, or gallops Lungs: distant breath sounds but what is audible is clear to auscultation bilaterally, no wheezing, rhonchi or rales Abd: soft, nontender, no hepatomegaly, + BS MS: no deformity or atrophy Ext: baseline morbidly obese lower extremities, making edema difficult to assess Skin: warm and dry, no rash Neuro:  moves all extremities spontaneously, no focal abnormalities noted, follows commands Psych: euthymic mood, full affect   Recent Labs: 03/17/2015: B Natriuretic Peptide 189.9* 03/18/2015: ALT 24 03/21/2015: BUN 9; Creatinine, Ser 0.64; Hemoglobin 14.0; Magnesium 1.9; Platelets 391; Potassium 4.1; Sodium 138; TSH 2.615  03/18/2015: Cholesterol 152; HDL 44; LDL Cholesterol 99; Total CHOL/HDL Ratio 3.5; Triglycerides 43; VLDL 9   Estimated Creatinine Clearance: 192.1 mL/min (by C-G formula based on Cr of 0.64).   Wt Readings from Last 3 Encounters:  04/01/15 533 lb (241.767 kg)  03/23/15 537 lb 11.2 oz (243.9 kg)  01/27/15 525 lb (238.138 kg)     Other studies reviewed: Additional studies/records reviewed today include: summarized  above  ASSESSMENT AND PLAN:  1. Chronic diastolic CHF - suspect predominantly driven by her morbid obesity. We spent time reviewing daily weights, low sodium diet and fluid restriction. I am not confident in her ability to carry out these measures based on her response. I have given her information about low sodium foods. Regarding her broken scale, she still has the box it came in - I asked her to contact the manufacturer to see about getting a replacement. Continue current dose of Lasix and potassium. Check BMET today. 2. Morbid obesity with suspected OHS/OSA - I agree with Dr. Ludwig Clarks note that this is ultimately life-threatening. She inquired about whether we would consider prescribing a weight loss pill. I told her we do not typically manage these medicines and that she would most benefit from long-term lifestyle changes. She says she cannot exercise because she cannot afford the gym. We discussed free, simple ways to incorporate physical activity into her daily regimen (i.e. one of our  cardiologists advises walking laps around the couch/house during every commercial break - I think this is an excellent suggestion). Will refer to bariatric surgery. Body mass index is 92.92 kg/(m^2).  3. Chest tightness - suspect due in part to deconditioning. This has also improved since diuresis/discharge. Recent echo showed normal LV function without WMA, with recommendation to manage medically. Her weight precludes any further ischemic testing at this time (cath lab table limit 500lb) but we can reconsider in the future if her weight allows. Continue aspirin but decrease dose to 81mg  daily.  4. Essential HTN  - continue current regimen. Sodium and fluid restriction should help to bring this down further. She reports BP has usually been running 1teens/70s. 5. Tobacco abuse - cessation has been advised.  Disposition: F/u with Dr. Mayford Knife in 3 months.  Current medicines are reviewed at length with the patient  today.  The patient did not have any concerns regarding medicines.  Thomasene Mohair PA-C 04/01/2015 9:28 AM     [facility] [address] [place] [address] [phone] (office)  [phone] (fax)

## 2015-04-01 ENCOUNTER — Encounter: Payer: Self-pay | Admitting: Physician Assistant

## 2015-04-01 ENCOUNTER — Ambulatory Visit (INDEPENDENT_AMBULATORY_CARE_PROVIDER_SITE_OTHER): Payer: Medicaid Other | Admitting: Physician Assistant

## 2015-04-01 ENCOUNTER — Telehealth (HOSPITAL_COMMUNITY): Payer: Self-pay | Admitting: Surgery

## 2015-04-01 VITALS — BP 138/82 | HR 92 | Ht 63.5 in | Wt >= 6400 oz

## 2015-04-01 DIAGNOSIS — E662 Morbid (severe) obesity with alveolar hypoventilation: Secondary | ICD-10-CM

## 2015-04-01 DIAGNOSIS — I5032 Chronic diastolic (congestive) heart failure: Secondary | ICD-10-CM

## 2015-04-01 DIAGNOSIS — I10 Essential (primary) hypertension: Secondary | ICD-10-CM | POA: Diagnosis not present

## 2015-04-01 DIAGNOSIS — R0789 Other chest pain: Secondary | ICD-10-CM

## 2015-04-01 DIAGNOSIS — Z720 Tobacco use: Secondary | ICD-10-CM | POA: Diagnosis not present

## 2015-04-01 DIAGNOSIS — Z79899 Other long term (current) drug therapy: Secondary | ICD-10-CM

## 2015-04-01 LAB — BASIC METABOLIC PANEL
BUN: 7 mg/dL (ref 7–25)
CALCIUM: 9.2 mg/dL (ref 8.6–10.2)
CO2: 28 mmol/L (ref 20–31)
CREATININE: 0.58 mg/dL (ref 0.50–1.10)
Chloride: 96 mmol/L — ABNORMAL LOW (ref 98–110)
Glucose, Bld: 117 mg/dL — ABNORMAL HIGH (ref 65–99)
Potassium: 3.7 mmol/L (ref 3.5–5.3)
Sodium: 138 mmol/L (ref 135–146)

## 2015-04-01 MED ORDER — ASPIRIN EC 81 MG PO TBEC: 81.0000 mg | DELAYED_RELEASE_TABLET | Freq: Every day | ORAL | Status: AC

## 2015-04-01 NOTE — Patient Instructions (Addendum)
Dayna Dunn, PA-C, has recommended making the following medication changes: DECREASE Aspirin to 81 mg  Your physician recommends that you return for lab work TODAY.  Ronie Spies, PA-C recommends that you schedule a follow-up appointment in 3 months with Dr Mayford Knife.  Kriste Basque has referred you to [facility] for Bariatric Surgery.   Low-Sodium Eating Plan Sodium raises blood pressure and causes water to be held in the body. Getting less sodium from food will help lower your blood pressure, reduce any swelling, and protect your heart, liver, and kidneys. We get sodium by adding salt (sodium chloride) to food. Most of our sodium comes from canned, boxed, and frozen foods. Restaurant foods, fast foods, and pizza are also very high in sodium. Even if you take medicine to lower your blood pressure or to reduce fluid in your body, getting less sodium from your food is important. WHAT IS MY PLAN? Most people should limit their sodium intake to 2,300 mg a day. Your health care provider recommends that you limit your sodium intake to 2,000 mg a day.  WHAT DO I NEED TO KNOW ABOUT THIS EATING PLAN? For the low-sodium eating plan, you will follow these general guidelines:  Choose foods with a % Daily Value for sodium of less than 5% (as listed on the food label).   Use salt-free seasonings or herbs instead of table salt or sea salt.   Check with your health care provider or pharmacist before using salt substitutes.   Eat fresh foods.  Eat more vegetables and fruits.  Limit canned vegetables. If you do use them, rinse them well to decrease the sodium.   Limit cheese to 1 oz (28 g) per day.   Eat lower-sodium products, often labeled as "lower sodium" or "no salt added."  Avoid foods that contain monosodium glutamate (MSG). MSG is sometimes added to [place] food and some canned foods.  Check food labels (Nutrition Facts labels) on foods to learn how much sodium is in one  serving.  Eat more home-cooked food and less restaurant, buffet, and fast food.  When eating at a restaurant, ask that your food be prepared with less salt, or no salt if possible.  HOW DO I READ FOOD LABELS FOR SODIUM INFORMATION? The Nutrition Facts label lists the amount of sodium in one serving of the food. If you eat more than one serving, you must multiply the listed amount of sodium by the number of servings. Food labels may also identify foods as:  Sodium free--Less than 5 mg in a serving.  Very low sodium--35 mg or less in a serving.  Low sodium--140 mg or less in a serving.  Light in sodium--50% less sodium in a serving. For example, if a food that usually has 300 mg of sodium is changed to become light in sodium, it will have 150 mg of sodium.  Reduced sodium--25% less sodium in a serving. For example, if a food that usually has 400 mg of sodium is changed to reduced sodium, it will have 300 mg of sodium. WHAT FOODS CAN I EAT? Grains Low-sodium cereals, including oats, puffed wheat and rice, and shredded wheat cereals. Low-sodium crackers. Unsalted rice and pasta. Lower-sodium bread.  Vegetables Frozen or fresh vegetables. Low-sodium or reduced-sodium canned vegetables. Low-sodium or reduced-sodium tomato sauce and paste. Low-sodium or reduced-sodium tomato and vegetable juices.  Fruits Fresh, frozen, and canned fruit. Fruit juice.  Meat and Other Protein Products Low-sodium canned tuna and salmon. Fresh or frozen meat, poultry, seafood,  and fish. Lamb. Unsalted nuts. Dried beans, peas, and lentils without added salt. Unsalted canned beans. Homemade soups without salt. Eggs.  Dairy Milk. Soy milk. Ricotta cheese. Low-sodium or reduced-sodium cheeses. Yogurt.  Condiments Fresh and dried herbs and spices. Salt-free seasonings. Onion and garlic powders. Low-sodium varieties of mustard and ketchup. Fresh or refrigerated horseradish. Lemon juice.  Fats and  Oils Reduced-sodium salad dressings. Unsalted butter.  Other Unsalted popcorn and pretzels.  The items listed above may not be a complete list of recommended foods or beverages. Contact your dietitian for more options. WHAT FOODS ARE NOT RECOMMENDED? Grains Instant hot cereals. Bread stuffing, pancake, and biscuit mixes. Croutons. Seasoned rice or pasta mixes. Noodle soup cups. Boxed or frozen macaroni and cheese. Self-rising flour. Regular salted crackers. Vegetables Regular canned vegetables. Regular canned tomato sauce and paste. Regular tomato and vegetable juices. Frozen vegetables in sauces. Salted [place] fries. Olives. Rosita Fire. Relishes. Sauerkraut. Salsa. Meat and Other Protein Products Salted, canned, smoked, spiced, or pickled meats, seafood, or fish. Bacon, ham, sausage, hot dogs, corned beef, chipped beef, and packaged luncheon meats. Salt pork. Jerky. Pickled herring. Anchovies, regular canned tuna, and sardines. Salted nuts. Dairy Processed cheese and cheese spreads. Cheese curds. Blue cheese and cottage cheese. Buttermilk.  Condiments Onion and garlic salt, seasoned salt, table salt, and sea salt. Canned and packaged gravies. Worcestershire sauce. Tartar sauce. Barbecue sauce. Teriyaki sauce. Soy sauce, including reduced sodium. Steak sauce. Fish sauce. Oyster sauce. Cocktail sauce. Horseradish that you find on the shelf. Regular ketchup and mustard. Meat flavorings and tenderizers. Bouillon cubes. Hot sauce. Tabasco sauce. Marinades. Taco seasonings. Relishes. Fats and Oils Regular salad dressings. Salted butter. Margarine. Ghee. Bacon fat.  Other Potato and tortilla chips. Corn chips and puffs. Salted popcorn and pretzels. Canned or dried soups. Pizza. Frozen entrees and pot pies.  The items listed above may not be a complete list of foods and beverages to avoid. Contact your dietitian for more information.   This information is not intended to replace advice given  to you by your health care provider. Make sure you discuss any questions you have with your health care provider.   Document Released: 08/04/2001 Document Revised: 03/05/2014 Document Reviewed: 12/17/2012 Elsevier Interactive Patient Education [employer]

## 2015-04-01 NOTE — Telephone Encounter (Signed)
I called patient as I noticed in her appt notes from today that the PA said her scale (I provided) was not working.  I have asked her to call me back on Monday so that we can arrange to switch it out for another scale (that works correctly hopefully).  She is in agreement and says she will call me back Monday.

## 2015-04-04 ENCOUNTER — Telehealth: Payer: Self-pay | Admitting: *Deleted

## 2015-04-04 MED ORDER — POTASSIUM CHLORIDE CRYS ER 20 MEQ PO TBCR: 20.0000 meq | EXTENDED_RELEASE_TABLET | Freq: Two times a day (BID) | ORAL | Status: DC

## 2015-04-04 MED ORDER — POTASSIUM CHLORIDE CRYS ER 20 MEQ PO TBCR: 20.0000 meq | EXTENDED_RELEASE_TABLET | Freq: Two times a day (BID) | ORAL | Status: AC

## 2015-04-04 NOTE — Telephone Encounter (Signed)
-----   Message from Laurann Montana, [place] sent at 04/01/2015  4:48 PM EST ----- Please let patient know labs are stable except 2 things: - blood sugar remains elevated, needs to be monitored for pre-diabetes by primary care. A1C in the hospital recently was 6.3 (diabetic level is 6.5) - potassium is just slightly low on diuretic therapy. Increase KCl to BID. Thanks!  Dayna Dunn PA-C

## 2015-04-04 NOTE — Telephone Encounter (Signed)
Spoke with pt and informed her of lab results and new orders. Pt verbalized understanding and was in agreement with this plan.

## 2015-04-07 ENCOUNTER — Encounter: Payer: Medicaid Other | Admitting: Family Medicine

## 2015-04-22 ENCOUNTER — Other Ambulatory Visit: Payer: Self-pay | Admitting: *Deleted

## 2015-04-22 MED ORDER — FUROSEMIDE 40 MG PO TABS: 40.0000 mg | ORAL_TABLET | Freq: Two times a day (BID) | ORAL | Status: AC

## 2015-04-24 ENCOUNTER — Ambulatory Visit (HOSPITAL_BASED_OUTPATIENT_CLINIC_OR_DEPARTMENT_OTHER): Payer: Medicaid Other | Attending: Internal Medicine

## 2015-04-24 VITALS — Ht 63.5 in | Wt >= 6400 oz

## 2015-04-24 DIAGNOSIS — E669 Obesity, unspecified: Secondary | ICD-10-CM | POA: Diagnosis not present

## 2015-04-24 DIAGNOSIS — G4733 Obstructive sleep apnea (adult) (pediatric): Secondary | ICD-10-CM | POA: Diagnosis present

## 2015-04-24 DIAGNOSIS — R0683 Snoring: Secondary | ICD-10-CM | POA: Diagnosis not present

## 2015-04-24 DIAGNOSIS — Z6841 Body Mass Index (BMI) 40.0 and over, adult: Secondary | ICD-10-CM | POA: Diagnosis not present

## 2015-04-24 DIAGNOSIS — G4736 Sleep related hypoventilation in conditions classified elsewhere: Secondary | ICD-10-CM | POA: Insufficient documentation

## 2015-04-28 ENCOUNTER — Ambulatory Visit: Payer: Medicaid Other | Admitting: Family Medicine

## 2015-04-30 DIAGNOSIS — G4733 Obstructive sleep apnea (adult) (pediatric): Secondary | ICD-10-CM | POA: Diagnosis not present

## 2015-04-30 NOTE — Progress Notes (Addendum)
      Patient Name: Jillian Weber, [name] Study Date: 04/24/2015 Gender: Female D.O.B: May 01, 1975 Age (years): 39 Referring Provider: Leroy SeaPrashant K Singh Height (inches): 64 Interpreting Physician: Jetty Duhamellinton Kareemah Grounds MD, ABSM Weight (lbs): 537 RPSGT: Wylie HailDavis, Rico BMI: 94 MRN: [id number] Neck Size: 17.00 CLINICAL INFORMATION Sleep Study Type: NPSG Indication for sleep study: Obesity, OSA Epworth Sleepiness Score: 8  SLEEP STUDY TECHNIQUE As per the AASM Manual for the Scoring of Sleep and Associated Events v2.3 (April 2016) with a hypopnea requiring 4% desaturations. The channels recorded and monitored were frontal, central and occipital EEG, electrooculogram (EOG), submentalis EMG (chin), nasal and oral airflow, thoracic and abdominal wall motion, anterior tibialis EMG, snore microphone, electrocardiogram, and pulse oximetry.  MEDICATIONS Patient's medications include: charted for review. Medications self-administered by patient during sleep study : No sleep medicine administered.  SLEEP ARCHITECTURE The study was initiated at 10:56:43 PM and ended at 5:04:26 AM. Sleep onset time was 62.7 minutes and the sleep efficiency was 72.1%. The total sleep time was 265.0 minutes. Stage REM latency was 113.5 minutes. The patient spent 19.24% of the night in stage N1 sleep, 69.44% in stage N2 sleep, 0.00% in stage N3 and 11.32% in REM. Alpha intrusion was absent. Supine sleep was 85.66%. Wake after sleep onset 40 minutes  RESPIRATORY PARAMETERS The overall apnea/hypopnea index (AHI) was 27.2 per hour. There were 19 total apneas, including 19 obstructive, 0 central and 0 mixed apneas. There were 101 hypopneas and 14 RERAs. The AHI during Stage REM sleep was 44.0 per hour. AHI while supine was 20.1 per hour. The mean oxygen saturation was 84.04%. The minimum SpO2 during sleep was 53.00%. Loud snoring was noted during this study.  CARDIAC DATA The 2 lead EKG demonstrated sinus rhythm. The mean  heart rate was 88.05 beats per minute. Other EKG findings include: None.  LEG MOVEMENT DATA The total PLMS were 0 with a resulting PLMS index of 0.00. Associated arousal with leg movement index was 0.0 .  IMPRESSIONS - Moderate obstructive sleep apnea occurred during this study (AHI = 27.2/h). - Delayed sleep onset with insufficient early sleep time and events to meet protocol requirements for split CPAP titration on this study night - No significant central sleep apnea occurred during this study (CAI = 0.0/h). - Severe oxygen desaturation was noted during this study (Min O2 = 53.00%). - The patient snored with Loud snoring volume. - No cardiac abnormalities were noted during this study. - Clinically significant periodic limb movements did not occur during sleep. No significant associated arousals.  DIAGNOSIS - Obstructive Sleep Apnea (327.23 [G47.33 ICD-10]) - Nocturnal Hypoxemia (327.26 [G47.36 ICD-10]  RECOMMENDATIONS - Therapeutic CPAP titration to determine optimal pressure required to alleviate sleep disordered breathing. - If PAP therapy is insufficient to correct hypoxemia, patient may require supplemental oxygen. - Avoid alcohol, sedatives and other CNS depressants that may worsen sleep apnea and disrupt normal sleep architecture. - Sleep hygiene should be reviewed to assess factors that may improve sleep quality. - Weight management and regular exercise should be initiated or continued if appropriate.      Waymon BudgeYOUNG,Arion Shankles D Diplomate, American Board of Sleep Medicine  ELECTRONICALLY SIGNED ON:  04/30/2015, 10:06 AM [facility] PH: [phone]   FX: [phone] ACCREDITED BY THE AMERICAN ACADEMY OF SLEEP MEDICINE

## 2015-06-29 ENCOUNTER — Encounter: Payer: Self-pay | Admitting: Cardiology

## 2015-06-29 ENCOUNTER — Ambulatory Visit (INDEPENDENT_AMBULATORY_CARE_PROVIDER_SITE_OTHER): Payer: Medicaid Other | Admitting: Cardiology

## 2015-06-29 VITALS — BP 124/88 | HR 100 | Ht 63.0 in | Wt >= 6400 oz

## 2015-06-29 DIAGNOSIS — G4733 Obstructive sleep apnea (adult) (pediatric): Secondary | ICD-10-CM | POA: Diagnosis not present

## 2015-06-29 DIAGNOSIS — R079 Chest pain, unspecified: Secondary | ICD-10-CM | POA: Diagnosis not present

## 2015-06-29 DIAGNOSIS — I5032 Chronic diastolic (congestive) heart failure: Secondary | ICD-10-CM

## 2015-06-29 DIAGNOSIS — R0602 Shortness of breath: Secondary | ICD-10-CM

## 2015-06-29 HISTORY — DX: Chest pain, unspecified: R07.9

## 2015-06-29 LAB — BASIC METABOLIC PANEL
BUN: 6 mg/dL — AB (ref 7–25)
CHLORIDE: 100 mmol/L (ref 98–110)
CO2: 27 mmol/L (ref 20–31)
CREATININE: 0.52 mg/dL (ref 0.50–1.10)
Calcium: 8.9 mg/dL (ref 8.6–10.2)
GLUCOSE: 147 mg/dL — AB (ref 65–99)
Potassium: 3.9 mmol/L (ref 3.5–5.3)
Sodium: 138 mmol/L (ref 135–146)

## 2015-06-29 LAB — BRAIN NATRIURETIC PEPTIDE: Brain Natriuretic Peptide: 4 pg/mL (ref <100)

## 2015-06-29 NOTE — Patient Instructions (Signed)
Medication Instructions:  Your physician recommends that you continue on your current medications as directed. Please refer to the Current Medication list given to you today.   Labwork: TODAY: BMET, BNP  Testing/Procedures: Dr. Mayford Knifeurner recommends you have a CPAP TITRATION.  Follow-Up: Your physician wants you to follow-up in: 6 months with Dr. Mayford Knifeurner. You will receive a reminder letter in the mail two months in advance. If you don't receive a letter, please call our office to schedule the follow-up appointment.   Any Other Special Instructions Will Be Listed Below (If Applicable).     If you need a refill on your cardiac medications before your next appointment, please call your pharmacy.

## 2015-06-29 NOTE — Progress Notes (Signed)
Cardiology Office Note    Date:  06/29/2015   ID:  Jillian Weber, DOB 12/02/1975, MRN [id number]  PCP:  Concepcion Living, NP  Cardiologist:  Quintella Reichert, MD   Chief Complaint  Patient presents with  . Congestive Heart Failure  . Sleep Apnea    History of Present Illness:  Jillian Weber is a 40 y.o. female  with history of morbid obesity with alveolar hypoventilation, arthritis, OSA, tobacco abuse, HTN, migraines presents for post-hospital follow-up.  She was admitted 02/2015 with worsening DOE and LEE. She was diagnosed with acute on chronic hypoxic/hypercarbic repspiratory failure felt due in part to acute diasotlic CHF. 2D Echo 03/18/15: EF 60-65%, no RWMA, grade 1 DD. Labs notable for troponin peak 0.06, TSH WNL, hypercarbia with peak CO2 36, hyperglycemia up to 173 (A1C 6.3), LDL 99. Troponin trend was not c/w ACS; suspected due to demand ischemia/CHF. D-dimer 0.53; LE duplex neg for DVT. CXR showed hypoinflation of lungs with probable mild right basilar subsegmental atx. She weighed 590 on admission and was discharged at 545lb.  Since she was seen in February,  SOB has worsened with exertion and she gets chest pain that she describes as a dull pain and a sharp pain in her left chest and arm that lasts 5-40minutes.  These are nonexertional and improve with a change in position.  [name] Her weight is down 6 lbs from February.   She does not exercise much as she says she cannot afford the gym.  She has been trying to pay attention to low sodium diet but admits it has been difficult. She continues to drink soda and fruit punch.  She was given a scale at discharge by social worker but reports it is no longer functioning normally. Sleep study was done 04/30/2015 showing moderate OSA with an AHI of 27/hr with oxygen desaturations as low as 53% with loud snoring.  CPAP therapy was recommended but this has not been followed up on.  She complains of hand a pedal edema but it has improved  since her hospitalization.    Past Medical History  Diagnosis Date  . Morbid obesity (HCC)   . Hypertension   . Arthritis   . Carpal tunnel syndrome   . Migraine   . Tobacco abuse   . Obesity hypoventilation syndrome (HCC)   . Chronic diastolic CHF (congestive heart failure) (HCC)   . Chest pain 06/29/2015    Past Surgical History  Procedure Laterality Date  . Tonsillectomy      X 2  . Tubal ligation    . Wrist surgery      bilateral, carpal tunnel  . Cholecystectomy      Current Medications: Outpatient Prescriptions Prior to Visit  Medication Sig Dispense Refill  . Acetaminophen (TYLENOL ARTHRITIS EXT RELIEF PO) Take 1 tablet by mouth 2 (two) times daily.    [name] albuterol (PROVENTIL HFA;VENTOLIN HFA) 108 (90 BASE) MCG/ACT inhaler Inhale 2 puffs into the lungs every 2 (two) hours as needed for wheezing or shortness of breath (cough). 1 Inhaler 0  . aspirin 81 MG tablet Take 1 tablet (81 mg total) by mouth daily.    . furosemide (LASIX) 40 MG tablet Take 1 tablet (40 mg total) by mouth 2 (two) times daily. 60 tablet 10  . MELATONIN PO Take 2 mLs by mouth daily as needed. For sleep    . Menthol, Topical Analgesic, (ICY HOT MEDICATED SPRAY EX) Apply 1 spray topically daily as needed. For pain    .  potassium chloride SA (K-DUR,KLOR-CON) 20 MEQ tablet Take 1 tablet (20 mEq total) by mouth 2 (two) times daily. 180 tablet 3  . SUMAtriptan (IMITREX) 100 MG tablet Take one at earliest onset of migraine headache. May repeat in 2 hours if headache persist. No more than 2 in 24 hours. (Patient taking differently: Take 100 mg by mouth every 2 (two) hours as needed. Take one at earliest onset of migraine headache. May repeat in 2 hours if headache persist. No more than 2 in 24 hours.) 10 tablet 0   No facility-administered medications prior to visit.     Allergies:   Review of patient's allergies indicates no known allergies.   Social History   Social History  . Marital Status: Married     Spouse Name: N/A  . Number of Children: N/A  . Years of Education: N/A   Social History Main Topics  . Smoking status: Current Every Day Smoker -- 0.50 packs/day for 10 years    Types: Cigarettes  . Smokeless tobacco: Never Used  . Alcohol Use: 0.0 oz/week    0 Standard drinks or equivalent per week     Comment: occ  . Drug Use: No  . Sexual Activity:    Partners: Male    Birth Control/ Protection: Surgical   Other Topics Concern  . None   Social History Narrative     Family History:  The patient's family history includes Asthma in her father and sister; Cancer in her mother; Coronary artery disease in her mother; Diabetes in her father and sister; Hypertension in her father and mother; Osteoarthritis in her mother.   ROS:   Please see the history of present illness.    ROS All other systems reviewed and are negative.   PHYSICAL EXAM:   VS:  BP 124/88 mmHg  Pulse 100  Ht 5\' 3"  (1.6 m)  Wt 531 lb 6.4 oz (241.041 kg)  BMI 94.16 kg/m2   GEN: Well nourished, well developed, in no acute distress HEENT: normal Neck: no JVD, carotid bruits, or masses Cardiac: RRR; no murmurs, rubs, or gallops,no edema.  Intact distal pulses bilaterally.  Respiratory:  clear to auscultation bilaterally, normal work of breathing GI: soft, nontender, nondistended, + BS MS: no deformity or atrophy Skin: warm and dry, no rash Neuro:  Alert and Oriented x 3, Strength and sensation are intact Psych: euthymic mood, full affect  Wt Readings from Last 3 Encounters:  06/29/15 531 lb 6.4 oz (241.041 kg)  04/24/15 537 lb (243.582 kg)  04/01/15 533 lb (241.767 kg)      Studies/Labs Reviewed:   EKG:  EKG is ordered today. It shows NSR at 87bpm with no ST changes  Recent Labs: 03/17/2015: B Natriuretic Peptide 189.9* 03/18/2015: ALT 24 03/21/2015: Hemoglobin 14.0; Magnesium 1.9; Platelets 391; TSH 2.615 04/01/2015: BUN 7; Creat 0.58; Potassium 3.7; Sodium 138   Lipid Panel    Component Value  Date/Time   CHOL 152 03/18/2015 0037   TRIG 43 03/18/2015 0037   HDL 44 03/18/2015 0037   CHOLHDL 3.5 03/18/2015 0037   VLDL 9 03/18/2015 0037   LDLCALC 99 03/18/2015 0037    Additional studies/ records that were reviewed today include:  none    ASSESSMENT:    1. OSA (obstructive sleep apnea)   2. Morbid obesity, unspecified obesity type (HCC)   3. Chest pain, unspecified chest pain type   4. SOB (shortness of breath)   5. Chronic diastolic CHF (congestive heart failure) (HCC)  PLAN:  In order of problems listed above:  1. OSA - moderate in severity with severe oxygen desaturations down to 53% with respiratory events.  I will set her up for an in lab CPAP titration.   2. Morbid obesity - I have encouraged her to get into a routine exercise program and cut back on carbs and portions as well as sugar drinks. 3. Chest pain that is somewhat atypical.  Unfortunately her weight exceeds the limit for any cardiac testing with stress test or CT.  I told her she needs to lose significant weight in order to be able to assess her any further for CAD.  She could have underlying CAD but unfortunately her weight is prohibitive for any ischemic workup at this time.  I will place her on ASA daily.  4. SOB - I suspect this is multifactorial from her morbid obesity, diastolic CHF and sedentary state.  BNP pending.  EF normal on recent echo.  5. Chronic diastolic CHF - she is noncompliant with sodium restricted diet and with weighing herself.  She is probably euvolemic on exam but extremely difficult to assess due to morbid obesity.  Since she is having increased SOB, I will check a BNP.  I discussed the need to be compliant with sodium restriction to avoid CHF.    Followup with me in 6 months   Medication Adjustments/Labs and Tests Ordered: Current medicines are reviewed at length with the patient today.  Concerns regarding medicines are outlined above.  Medication changes, Labs and Tests  ordered today are listed in the Patient Instructions below.   Harlon Flor, MD  06/29/2015 10:12 AM    [facility] [address] [place], [place], [place]  [address] Phone: [phone]; Fax: [phone]

## 2015-07-22 ENCOUNTER — Ambulatory Visit: Payer: Medicaid Other | Admitting: Family Medicine

## 2015-07-29 ENCOUNTER — Encounter (HOSPITAL_BASED_OUTPATIENT_CLINIC_OR_DEPARTMENT_OTHER): Payer: Medicaid Other

## 2015-07-31 ENCOUNTER — Ambulatory Visit (HOSPITAL_BASED_OUTPATIENT_CLINIC_OR_DEPARTMENT_OTHER): Payer: Medicaid Other | Attending: Cardiology | Admitting: Cardiology

## 2015-07-31 VITALS — Ht 63.5 in | Wt >= 6400 oz

## 2015-07-31 DIAGNOSIS — G4736 Sleep related hypoventilation in conditions classified elsewhere: Secondary | ICD-10-CM | POA: Diagnosis not present

## 2015-07-31 DIAGNOSIS — R0683 Snoring: Secondary | ICD-10-CM | POA: Insufficient documentation

## 2015-07-31 DIAGNOSIS — G4734 Idiopathic sleep related nonobstructive alveolar hypoventilation: Secondary | ICD-10-CM | POA: Diagnosis not present

## 2015-07-31 DIAGNOSIS — G4733 Obstructive sleep apnea (adult) (pediatric): Secondary | ICD-10-CM | POA: Insufficient documentation

## 2015-07-31 HISTORY — PX: CPAP TITRATION: SLE1004

## 2015-08-07 ENCOUNTER — Encounter (HOSPITAL_BASED_OUTPATIENT_CLINIC_OR_DEPARTMENT_OTHER): Payer: Self-pay | Admitting: Cardiology

## 2015-08-07 ENCOUNTER — Telehealth: Payer: Self-pay | Admitting: Cardiology

## 2015-08-07 DIAGNOSIS — G4733 Obstructive sleep apnea (adult) (pediatric): Secondary | ICD-10-CM

## 2015-08-07 DIAGNOSIS — G4734 Idiopathic sleep related nonobstructive alveolar hypoventilation: Secondary | ICD-10-CM

## 2015-08-07 HISTORY — DX: Idiopathic sleep related nonobstructive alveolar hypoventilation: G47.34

## 2015-08-07 NOTE — Addendum Note (Signed)
Addended by: Quintella ReichertURNER, TRACI R on: 08/07/2015 09:45 PM   Modules accepted: Orders

## 2015-08-07 NOTE — Telephone Encounter (Signed)
Please get an overnight pulse oximetry and CPAP

## 2015-08-07 NOTE — Procedures (Addendum)
   Patient Name: Jillian Weber, Mack MRN: [id number] Study Date: 07/31/2015 Gender: Female D.O.B: December 21, 1975 Age (years): [age] Referring Provider: Armanda Magicraci Joy Reiger MD, ABSM Interpreting Physician: Armanda Magicraci Leman Martinek MD, ABSM RPSGT: Wylie Hailavis, Rico  Weight (lbs): 537 BMI: 94 Height (inches): 64 Neck Size: 17.00  CLINICAL INFORMATION The patient is referred for a CPAP titration to treat sleep apnea.   SLEEP STUDY TECHNIQUE As per the AASM Manual for the Scoring of Sleep and Associated Events v2.3 (April 2016) with a hypopnea requiring 4% desaturations. The channels recorded and monitored were frontal, central and occipital EEG, electrooculogram (EOG), submentalis EMG (chin), nasal and oral airflow, thoracic and abdominal wall motion, anterior tibialis EMG, snore microphone, electrocardiogram, and pulse oximetry. Continuous positive airway pressure (CPAP) was initiated at the beginning of the study and titrated to treat sleep-disordered breathing.  MEDICATIONS Medications taken by the patient : Reviewed in the chart Medications administered by patient during sleep study : No sleep medicine administered.  TECHNICIAN COMMENTS Comments added by technician: Patient had difficulty initiating sleep. O2 initiated due to low sats.  Comments added by scorer: N/A  RESPIRATORY PARAMETERS Optimal PAP Pressure (cm):13  AHI at Optimal Pressure (/hr):6.0 Overall Minimal O2 (%):60.00  Supine % at Optimal Pressure (%):100 Minimal O2 at Optimal Pressure (%):78.0    SLEEP ARCHITECTURE The study was initiated at 10:02:23 PM and ended at 4:12:13 AM. Sleep onset time was 143.5 minutes and the sleep efficiency was reduced at 58.4%. The total sleep time was 216.0 minutes. The patient spent 11.11% of the night in stage N1 sleep, 62.04% in stage N2 sleep, 0.00% in stage N3 and 26.85% in REM.Stage REM latency was 29.5 minutes Wake after sleep onset was 10.3. Alpha intrusion was absent. Supine sleep was  100.00%.  CARDIAC DATA The 2 lead EKG demonstrated sinus rhythm. The mean heart rate was 83.86 beats per minute. Other EKG findings include: None.  LEG MOVEMENT DATA The total Periodic Limb Movements of Sleep (PLMS) were 0. The PLMS index was 0.00. A PLMS index of <15 is considered normal in adults.  IMPRESSIONS - The optimal PAP pressure was 13 cm of water. - Central sleep apnea was not noted during this titration (CAI = 0.0/h). - Severe oxygen desaturations were observed during this titration (min O2 = 60.00%). - The patient snored with Loud snoring volume during this titration study. - No cardiac abnormalities were observed during this study. - Clinically significant periodic limb movements were not noted during this study. Arousals associated with PLMs were rare.  DIAGNOSIS - Obstructive Sleep Apnea (327.23 [G47.33 ICD-10]) - Nocturnal Hypoxemia  RECOMMENDATIONS - Trial of CPAP therapy on 13 cm H2O with a Small size Resmed Full Face Mask AirFit F20 mask and heated humidification and oxygen at 3L via CPAP. - Avoid alcohol, sedatives and other CNS depressants that may worsen sleep apnea and disrupt normal sleep architecture. - Sleep hygiene should be reviewed to assess factors that may improve sleep quality. - Weight management and regular exercise should be initiated or continued. - Return to Sleep Center for re-evaluation after 6 weeks of therapy  Armanda Magicraci Jacobo Moncrief Diplomate, American Board of Sleep Medicine  ELECTRONICALLY SIGNED ON:  08/07/2015, 9:32 PM [facility] PH: [phone]   FX: [phone] ACCREDITED BY THE AMERICAN ACADEMY OF SLEEP MEDICINE

## 2015-08-07 NOTE — Telephone Encounter (Signed)
Pt had successful PAP titration. Please setup appointment in 6 weeks. Please let [facility] know that order for PAP is in EPIC.

## 2015-08-08 ENCOUNTER — Encounter (HOSPITAL_COMMUNITY): Payer: Self-pay

## 2015-08-08 ENCOUNTER — Emergency Department (HOSPITAL_COMMUNITY): Payer: Medicaid Other

## 2015-08-08 DIAGNOSIS — Z7982 Long term (current) use of aspirin: Secondary | ICD-10-CM | POA: Diagnosis not present

## 2015-08-08 DIAGNOSIS — J9801 Acute bronchospasm: Secondary | ICD-10-CM | POA: Diagnosis not present

## 2015-08-08 DIAGNOSIS — R079 Chest pain, unspecified: Secondary | ICD-10-CM | POA: Insufficient documentation

## 2015-08-08 DIAGNOSIS — Z79899 Other long term (current) drug therapy: Secondary | ICD-10-CM | POA: Insufficient documentation

## 2015-08-08 DIAGNOSIS — I110 Hypertensive heart disease with heart failure: Secondary | ICD-10-CM | POA: Insufficient documentation

## 2015-08-08 DIAGNOSIS — Z6841 Body Mass Index (BMI) 40.0 and over, adult: Secondary | ICD-10-CM | POA: Diagnosis not present

## 2015-08-08 DIAGNOSIS — R0602 Shortness of breath: Secondary | ICD-10-CM | POA: Diagnosis present

## 2015-08-08 DIAGNOSIS — I5032 Chronic diastolic (congestive) heart failure: Secondary | ICD-10-CM | POA: Diagnosis not present

## 2015-08-08 DIAGNOSIS — F17210 Nicotine dependence, cigarettes, uncomplicated: Secondary | ICD-10-CM | POA: Diagnosis not present

## 2015-08-08 LAB — CBC
HCT: 44.6 % (ref 36.0–46.0)
Hemoglobin: 14.1 g/dL (ref 12.0–15.0)
MCH: 32.3 pg (ref 26.0–34.0)
MCHC: 31.6 g/dL (ref 30.0–36.0)
MCV: 102.3 fL — ABNORMAL HIGH (ref 78.0–100.0)
PLATELETS: 362 10*3/uL (ref 150–400)
RBC: 4.36 MIL/uL (ref 3.87–5.11)
RDW: 14.7 % (ref 11.5–15.5)
WBC: 10.7 10*3/uL — AB (ref 4.0–10.5)

## 2015-08-08 LAB — BASIC METABOLIC PANEL
Anion gap: 7 (ref 5–15)
CO2: 29 mmol/L (ref 22–32)
CREATININE: 0.57 mg/dL (ref 0.44–1.00)
Calcium: 9.5 mg/dL (ref 8.9–10.3)
Chloride: 102 mmol/L (ref 101–111)
GFR calc Af Amer: >60 mL/min (ref >60)
Glucose, Bld: 104 mg/dL — ABNORMAL HIGH (ref 65–99)
Potassium: 4.4 mmol/L (ref 3.5–5.1)
SODIUM: 138 mmol/L (ref 135–145)

## 2015-08-08 LAB — I-STAT TROPONIN, ED: Troponin i, poc: 0.01 ng/mL (ref 0.00–0.08)

## 2015-08-08 MED ORDER — ALBUTEROL SULFATE (2.5 MG/3ML) 0.083% IN NEBU
INHALATION_SOLUTION | RESPIRATORY_TRACT | Status: AC
  Filled 2015-08-08: qty 6

## 2015-08-08 MED ORDER — ALBUTEROL SULFATE (2.5 MG/3ML) 0.083% IN NEBU
5.0000 mg | INHALATION_SOLUTION | Freq: Once | RESPIRATORY_TRACT | Status: AC
  Administered 2015-08-08: 5 mg via RESPIRATORY_TRACT

## 2015-08-08 MED ORDER — ALBUTEROL SULFATE (2.5 MG/3ML) 0.083% IN NEBU: 5.0000 mg | INHALATION_SOLUTION | Freq: Once | RESPIRATORY_TRACT | Status: DC

## 2015-08-08 NOTE — ED Notes (Signed)
Shortness of breath started a week ago. Patient states it started with a persistant cough and has gotten worse. Patient presents diaphoretic and wheezing noted bilaterally, and chest pain  . History of CHF, OSA.

## 2015-08-08 NOTE — Addendum Note (Signed)
Addended by: Arcola JanskyOOK, Meganne Rita M on: 08/08/2015 10:07 AM   Modules accepted: Orders

## 2015-08-08 NOTE — Telephone Encounter (Signed)
Patient informed of information. Stated verbal understanding.  [facility]   Once patient has started CPAP Therapy, I will schedule 10 week follow-up.

## 2015-08-09 ENCOUNTER — Emergency Department (HOSPITAL_COMMUNITY)
Admission: EM | Admit: 2015-08-09 | Discharge: 2015-08-09 | Disposition: A | Discharge status: Home / Self Care | Payer: Medicaid Other | Attending: Emergency Medicine | Admitting: Emergency Medicine

## 2015-08-09 DIAGNOSIS — J9801 Acute bronchospasm: Secondary | ICD-10-CM

## 2015-08-09 LAB — I-STAT TROPONIN, ED: TROPONIN I, POC: 0 ng/mL (ref 0.00–0.08)

## 2015-08-09 LAB — BRAIN NATRIURETIC PEPTIDE: B Natriuretic Peptide: 18.8 pg/mL (ref 0.0–100.0)

## 2015-08-09 MED ORDER — IPRATROPIUM-ALBUTEROL 0.5-2.5 (3) MG/3ML IN SOLN
3.0000 mL | Freq: Once | RESPIRATORY_TRACT | Status: AC
  Administered 2015-08-09: 3 mL via RESPIRATORY_TRACT
  Filled 2015-08-09: qty 3

## 2015-08-09 MED ORDER — ALBUTEROL SULFATE HFA 108 (90 BASE) MCG/ACT IN AERS: 2.0000 | INHALATION_SPRAY | RESPIRATORY_TRACT | Status: AC | PRN

## 2015-08-09 MED ORDER — PREDNISONE 20 MG PO TABS: 60.0000 mg | ORAL_TABLET | Freq: Every day | ORAL | Status: AC

## 2015-08-09 MED ORDER — FUROSEMIDE 10 MG/ML IJ SOLN
40.0000 mg | Freq: Once | INTRAMUSCULAR | Status: AC
  Administered 2015-08-09: 40 mg via INTRAVENOUS
  Filled 2015-08-09: qty 4

## 2015-08-09 NOTE — Discharge Instructions (Signed)
Bronchospasm, Adult  A bronchospasm is a spasm or tightening of the airways going into the lungs. During a bronchospasm breathing becomes more difficult because the airways get smaller. When this happens there can be coughing, a whistling sound when breathing (wheezing), and difficulty breathing. Bronchospasm is often associated with asthma, but not all patients who experience a bronchospasm have asthma.  CAUSES   A bronchospasm is caused by inflammation or irritation of the airways. The inflammation or irritation may be triggered by:   · Allergies (such as to animals, pollen, food, or mold). Allergens that cause bronchospasm may cause wheezing immediately after exposure or many hours later.    · Infection. Viral infections are believed to be the most common cause of bronchospasm.    · Exercise.    · Irritants (such as pollution, cigarette smoke, strong odors, aerosol sprays, and paint fumes).    · Weather changes. Winds increase molds and pollens in the air. Rain refreshes the air by washing irritants out. Cold air may cause inflammation.    · Stress and emotional upset.    SIGNS AND SYMPTOMS   · Wheezing.    · Excessive nighttime coughing.    · Frequent or severe coughing with a simple cold.    · Chest tightness.    · Shortness of breath.    DIAGNOSIS   Bronchospasm is usually diagnosed through a history and physical exam. Tests, such as chest X-rays, are sometimes done to look for other conditions.  TREATMENT   · Inhaled medicines can be given to open up your airways and help you breathe. The medicines can be given using either an inhaler or a nebulizer machine.  · Corticosteroid medicines may be given for severe bronchospasm, usually when it is associated with asthma.  HOME CARE INSTRUCTIONS   · Always have a plan prepared for seeking medical care. Know when to call your health care provider and local emergency services (911 in the [place] Know where you can access local emergency care.  · Only take medicines as  directed by your health care provider.  · If you were prescribed an inhaler or nebulizer machine, ask your health care provider to explain how to use it correctly. Always use a spacer with your inhaler if you were given one.  · It is necessary to remain calm during an attack. Try to relax and breathe more slowly.   · Control your home environment in the following ways:      Change your heating and air conditioning filter at least once a month.      Limit your use of fireplaces and wood stoves.    Do not smoke and do not allow smoking in your home.      Avoid exposure to perfumes and fragrances.      Get rid of pests (such as roaches and mice) and their droppings.      Throw away plants if you see mold on them.      Keep your house clean and dust free.      Replace carpet with wood, tile, or vinyl flooring. Carpet can trap dander and dust.      Use allergy-proof pillows, mattress covers, and box spring covers.      Wash bed sheets and blankets every week in hot water and dry them in a dryer.      Use blankets that are made of polyester or cotton.      Wash hands frequently.  SEEK MEDICAL CARE IF:   · You have muscle aches.    · You have chest pain.    · The sputum changes from clear or   white to yellow, green, gray, or bloody.    · The sputum you cough up gets thicker.    · There are problems that may be related to the medicine you are given, such as a rash, itching, swelling, or trouble breathing.    SEEK IMMEDIATE MEDICAL CARE IF:   · You have worsening wheezing and coughing even after taking your prescribed medicines.    · You have increased difficulty breathing.    · You develop severe chest pain.  MAKE SURE YOU:   · Understand these instructions.  · Will watch your condition.  · Will get help right away if you are not doing well or get worse.     This information is not intended to replace advice given to you by your health care provider. Make sure you discuss any questions you have with your health care  provider.     Document Released: 02/15/2003 Document Revised: 03/05/2014 Document Reviewed: 08/04/2012  Elsevier Interactive Patient Education ©2016 [employer]

## 2015-08-09 NOTE — ED Provider Notes (Signed)
CSN: [id number]     Arrival date & time 08/08/15  1818 History  By signing my name below, I, Rosario AdieWilliam Andrew Hiatt, attest that this documentation has been prepared under the direction and in the presence of Gilda Creasehristopher J Pollina, MD.  Electronically Signed: Rosario AdieWilliam Andrew Hiatt, ED Scribe. 08/09/2015. 2:32 AM.   Chief Complaint  Patient presents with  . Shortness of Breath  . Chest Pain   The history is provided by the patient. No language interpreter was used.   HPI Comments: Jillian Weber is a 40 y.o. female with a PMHx morbid obesity, CHF, HTN, Arthritis, and Migraines, who presents to the Emergency Department complaining of gradual onset, gradually worsening, constant shortness of breath onset 1 week ago. She has associated edema to her Bilat LE, persistent cough, diaphoresis, and CP. She reports that her chest pain radiates into her L arm and is also constant. Pt reports that positional changes exaccerbate her sx. Pt has a hx of Bilat LE edema and she currently takes 40mg  of Lasix BID, but it has not relieved her symptoms. No other alleviating factors noted.   Past Medical History  Diagnosis Date  . Morbid obesity (HCC)   . Hypertension   . Arthritis   . Carpal tunnel syndrome   . Migraine   . Tobacco abuse   . Obesity hypoventilation syndrome (HCC)   . Chronic diastolic CHF (congestive heart failure) (HCC)   . Chest pain 06/29/2015  . Nocturnal hypoxemia 08/07/2015   Past Surgical History  Procedure Laterality Date  . Tonsillectomy      X 2  . Tubal ligation    . Wrist surgery      bilateral, carpal tunnel  . Cholecystectomy    . Cpap titration  07/31/2015   Family History  Problem Relation Age of Onset  . Hypertension Mother   . Cancer Mother   . Coronary artery disease Mother   . Osteoarthritis Mother   . Diabetes Father   . Hypertension Father   . Asthma Father   . Asthma Sister   . Diabetes Sister    Social History  Substance Use Topics  . Smoking  status: Current Every Day Smoker -- 0.50 packs/day for 10 years    Types: Cigarettes  . Smokeless tobacco: Never Used  . Alcohol Use: No     Comment: occ   OB History    No data available     Review of Systems  Constitutional: Positive for diaphoresis. Negative for fever.  Respiratory: Positive for cough and shortness of breath.   Cardiovascular: Positive for chest pain and leg swelling.   Allergies  Review of patient's allergies indicates no known allergies.  Home Medications   Prior to Admission medications   Medication Sig Start Date End Date Taking? Authorizing Provider  Acetaminophen (TYLENOL ARTHRITIS EXT RELIEF PO) Take 1 tablet by mouth 2 (two) times daily.    Historical Provider, MD  albuterol (PROVENTIL HFA;VENTOLIN HFA) 108 (90 BASE) MCG/ACT inhaler Inhale 2 puffs into the lungs every 2 (two) hours as needed for wheezing or shortness of breath (cough). 12/19/14   Doreene ElandKehinde T Eniola, MD  aspirin 81 MG tablet Take 1 tablet (81 mg total) by mouth daily. 04/01/15   Dayna N Dunn, PA-C  furosemide (LASIX) 40 MG tablet Take 1 tablet (40 mg total) by mouth 2 (two) times daily. 04/22/15   Dayna N Dunn, PA-C  MELATONIN PO Take 2 mLs by mouth daily as needed. For sleep  Historical Provider, MD  Menthol, Topical Analgesic, (ICY HOT MEDICATED SPRAY EX) Apply 1 spray topically daily as needed. For pain    Historical Provider, MD  potassium chloride SA (K-DUR,KLOR-CON) 20 MEQ tablet Take 1 tablet (20 mEq total) by mouth 2 (two) times daily. 04/04/15   Dayna N Dunn, PA-C  SUMAtriptan (IMITREX) 100 MG tablet Take one at earliest onset of migraine headache. May repeat in 2 hours if headache persist. No more than 2 in 24 hours. Patient taking differently: Take 100 mg by mouth every 2 (two) hours as needed. Take one at earliest onset of migraine headache. May repeat in 2 hours if headache persist. No more than 2 in 24 hours. 02/07/15   Henrietta Hoover, NP   BP 127/78 mmHg  Pulse 81  Temp(Src)  98.1 F (36.7 C) (Oral)  Resp 24  Ht  (1.6 m)  Wt 542 lb 8.8 oz (246.1 kg)  BMI 96.13 kg/m2  SpO2 97%  LMP 07/13/2015   Physical Exam  Constitutional: She is oriented to person, place, and time. She appears well-developed and well-nourished. No distress.  HENT:  Head: Normocephalic and atraumatic.  Right Ear: Hearing normal.  Left Ear: Hearing normal.  Nose: Nose normal.  Mouth/Throat: Oropharynx is clear and moist and mucous membranes are normal.  Eyes: Conjunctivae and EOM are normal. Pupils are equal, round, and reactive to light.  Neck: Normal range of motion. Neck supple.  Cardiovascular: Regular rhythm, S1 normal and S2 normal.  Exam reveals no gallop and no friction rub.   No murmur heard. Bilateral LE edema  Pulmonary/Chest: Effort normal and breath sounds normal. No respiratory distress. She exhibits no tenderness.  Abdominal: Soft. Normal appearance and bowel sounds are normal. There is no hepatosplenomegaly. There is no tenderness. There is no rebound, no guarding, no tenderness at McBurney's point and negative Murphy's sign. No hernia.  Musculoskeletal: Normal range of motion.  Morbid obesity  Neurological: She is alert and oriented to person, place, and time. She has normal strength. No cranial nerve deficit or sensory deficit. Coordination normal. GCS eye subscore is 4. GCS verbal subscore is 5. GCS motor subscore is 6.  Skin: Skin is warm, dry and intact. No rash noted. No cyanosis.  Psychiatric: She has a normal mood and affect. Her speech is normal and behavior is normal. Thought content normal.  Nursing note and vitals reviewed.   ED Course  Procedures (including critical care time) DIAGNOSTIC STUDIES: Oxygen Saturation is 96% on RA, normal by my interpretation.   COORDINATION OF CARE: 2:18 AM-Discussed next steps with pt including DG Chest, EKG, and CMP. Pt verbalized understanding and is agreeable with the plan.   Labs Review Labs Reviewed  BASIC  METABOLIC PANEL - Abnormal; Notable for the following:    Glucose, Bld 104 (*)    BUN <5 (*)    All other components within normal limits  CBC - Abnormal; Notable for the following:    WBC 10.7 (*)    MCV 102.3 (*)    All other components within normal limits  BRAIN NATRIURETIC PEPTIDE  I-STAT TROPOININ, ED  Rosezena Sensor, ED    Imaging Review Dg Chest 2 View  08/08/2015  CLINICAL DATA:  Shortness of breath started about a week ago. EXAM: CHEST  2 VIEW COMPARISON:  03/17/2015 FINDINGS: Lung volumes are low. The lungs are clear wiithout focal pneumonia, edema, pneumothorax or pleural effusion. There is pulmonary vascular congestion without overt pulmonary edema. The cardiopericardial silhouette is  within normal limits for size. The visualized bony structures of the thorax are intact. IMPRESSION: Low volume film with vascular congestion. Electronically Signed   By: Kennith Center M.D.   On: 08/08/2015 19:13   I have personally reviewed and evaluated these images and lab results as part of my medical decision-making.   EKG Interpretation   Date/Time:  Monday August 08 2015 18:22:27 EDT Ventricular Rate:  92 PR Interval:  150 QRS Duration: 84 QT Interval:  350 QTC Calculation: 432 R Axis:   -142 Text Interpretation:  Normal sinus rhythm Right superior axis deviation  Possible Right ventricular hypertrophy Abnormal ECG No significant change  since last tracing Confirmed by POLLINA  MD, CHRISTOPHER ([id number]) on  08/09/2015 2:09:49 AM      MDM   Final diagnoses:  Bronchospasm   Patient presents to the emergency department for evaluation of shortness of breath. Patient reports that she has been feeling short of breath for 1 week. Symptoms began when she started to experience a cough that has been intermittently productive of sputum. Cough and shortness of breath has progressively worsened since it started. She has had pain in the chest as well. Patient does have a history of congestive  heart failure secondary to obstructive sleep apnea. She feels like she has had an increase in the swelling of her legs. She has been taking her Lasix 40 mg twice a day.  Examination does not reveal significant signs of volume overload. BNP is 18.8. Chest x-ray does not show evidence of congestion or edema. She has most likely euvolemic, however with her stated history of increased swelling, she was given additional diuretic coverage with IV Lasix and has had significant output here in the ER.  Termination, however, reveals persistent cough with wheezing. This is likely bronchospastic in nature and secondary to a viral infection. She does not have evidence of pneumonia on x-ray. She will be treated as an outpatient with short course of steroids and bronchodilator therapy.  I personally performed the services described in this documentation, which was scribed in my presence. The recorded information has been reviewed and is accurate.     Gilda Crease, MD 08/09/15 [phone]

## 2015-08-17 ENCOUNTER — Telehealth: Payer: Self-pay | Admitting: Cardiology

## 2015-08-17 NOTE — Telephone Encounter (Signed)
Calling because the company in which she gets her Sleep equipment from  [employer] and said they cannot complete the order , because they have not received any of the documentation of any of the sleep studies in which she had. Please call   Thanks

## 2015-08-18 NOTE — Telephone Encounter (Signed)
Patient aware that information is being [facility] to [facility].

## 2016-12-24 IMAGING — DX DG CHEST 2V
2 series · 2 of 2 positions shown · non-contrast
Comparison: 04/18/2014 and earlier.

CLINICAL DATA: Two week history of shortness of breath. Patient was
treated with antibiotics last week after being seen in [HOSPITAL]
with no relief. Current smoker.

EXAM:
CHEST  2 VIEW

[chest pa]
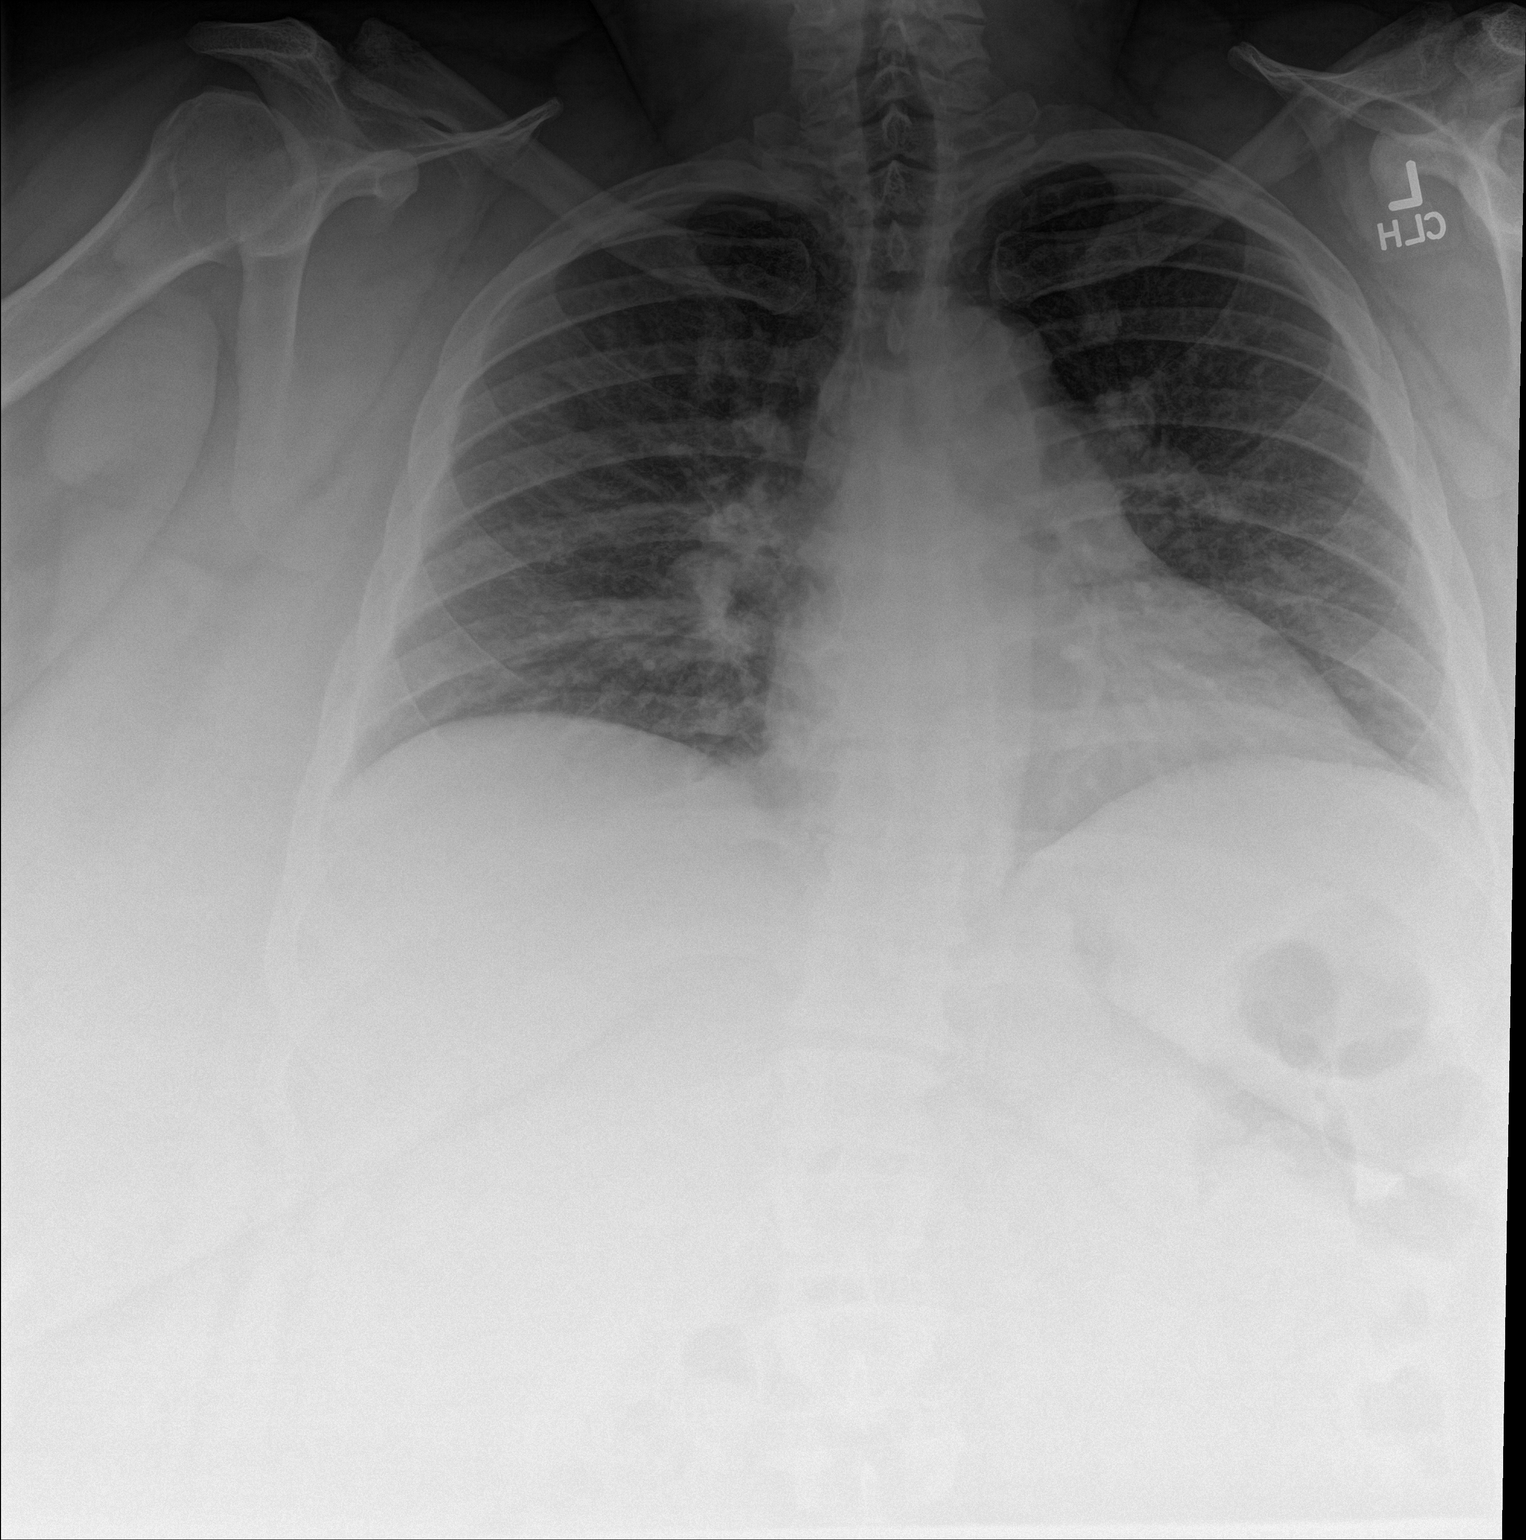

[chest lat]
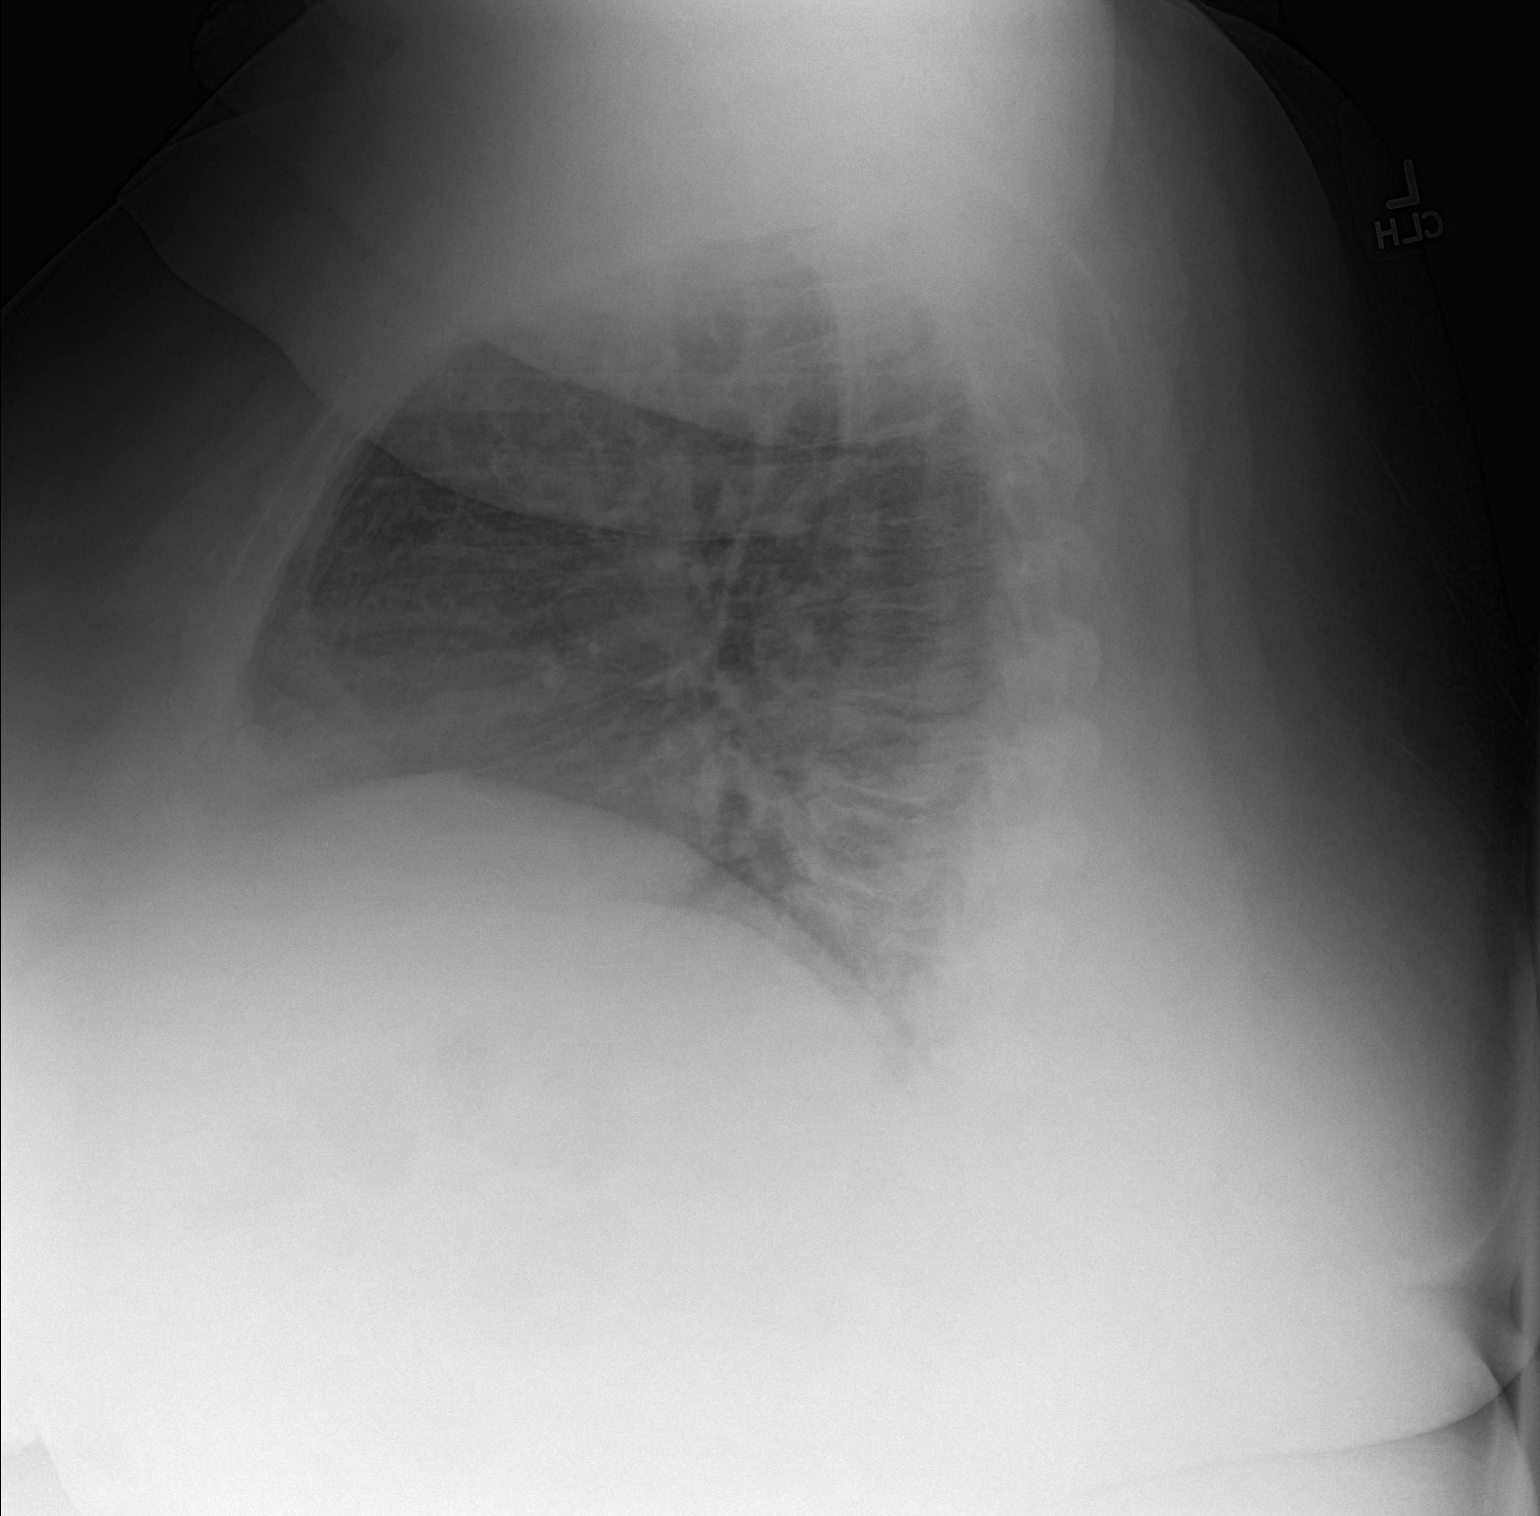

[2 of 2 positions shown; findings below may reference images not displayed]

FINDINGS: Suboptimal inspiration due to body habitus accounts for crowded
bronchovascular markings, especially in the bases, and accentuates
the cardiac silhouette. Taking this into account, cardiac silhouette
upper normal in size, unchanged. Hilar and mediastinal contours
unremarkable. Lungs clear. Bronchovascular markings normal.
Pulmonary vascularity normal. No visible pleural effusions. No
pneumothorax. Degenerative disc disease and spondylosis involving
the thoracic spine.
IMPRESSION: Suboptimal inspiration.  No acute cardiopulmonary disease.

## 2018-06-11 ENCOUNTER — Telehealth: Payer: Self-pay | Admitting: Cardiology

## 2018-06-11 NOTE — Telephone Encounter (Signed)
Recall- Spoke with patient has she has move back to [place] and is seeing a MD there.

## 2018-07-25 ENCOUNTER — Telehealth: Payer: Self-pay | Admitting: Cardiology

## 2018-07-25 NOTE — Telephone Encounter (Signed)
New Message            Clydie Braun with [facility] out of [place] is needing the most recent sleep study  Faxed to  [phone]

## 2018-07-29 NOTE — Telephone Encounter (Signed)
NPSG AND CPAP TITRATION sent to Clydie Braun at [facility] in [place] today.

## 2018-12-28 DEATH — deceased

## 2019-09-03 NOTE — Telephone Encounter (Signed)
In error
